# Patient Record
Sex: Female | Born: 1980
Health system: Southern US, Community
[De-identification: ages and names within clinical notes are randomized; demographics above are authoritative.]

## PROBLEM LIST (undated history)

## (undated) DIAGNOSIS — R079 Chest pain, unspecified: Secondary | ICD-10-CM

## (undated) DIAGNOSIS — E669 Obesity, unspecified: Secondary | ICD-10-CM

## (undated) DIAGNOSIS — I10 Essential (primary) hypertension: Secondary | ICD-10-CM

## (undated) DIAGNOSIS — Z973 Presence of spectacles and contact lenses: Secondary | ICD-10-CM

## (undated) DIAGNOSIS — Z9109 Other allergy status, other than to drugs and biological substances: Secondary | ICD-10-CM

## (undated) HISTORY — DX: Presence of spectacles and contact lenses: Z97.3

## (undated) HISTORY — DX: Obesity, unspecified: E66.9

## (undated) HISTORY — DX: Chest pain, unspecified: R07.9

---

## 2007-07-16 ENCOUNTER — Emergency Department (HOSPITAL_COMMUNITY): Admission: EM | Admit: 2007-07-16 | Discharge: 2007-07-16 | Payer: Self-pay | Admitting: Emergency Medicine

## 2007-07-18 ENCOUNTER — Emergency Department (HOSPITAL_COMMUNITY): Admission: EM | Admit: 2007-07-18 | Discharge: 2007-07-18 | Payer: Self-pay | Admitting: Emergency Medicine

## 2007-07-25 ENCOUNTER — Emergency Department (HOSPITAL_COMMUNITY): Admission: EM | Admit: 2007-07-25 | Discharge: 2007-07-25 | Payer: Self-pay | Admitting: *Deleted

## 2008-08-10 ENCOUNTER — Inpatient Hospital Stay (HOSPITAL_COMMUNITY): Admission: EM | Admit: 2008-08-10 | Discharge: 2008-08-15 | Payer: Self-pay | Admitting: Emergency Medicine

## 2008-08-12 ENCOUNTER — Encounter (INDEPENDENT_AMBULATORY_CARE_PROVIDER_SITE_OTHER): Payer: Self-pay | Admitting: Cardiology

## 2008-11-01 DIAGNOSIS — R079 Chest pain, unspecified: Secondary | ICD-10-CM

## 2008-11-01 HISTORY — DX: Chest pain, unspecified: R07.9

## 2009-08-27 IMAGING — CR DG CHEST 1V PORT
1 series · 1 of 1 positions shown · non-contrast
Comparison: None

CLINICAL DATA: Chest pain and shortness of breath.  Palpitations.
Atrial fibrillation.

CHEST - 1 VIEW

[AP]
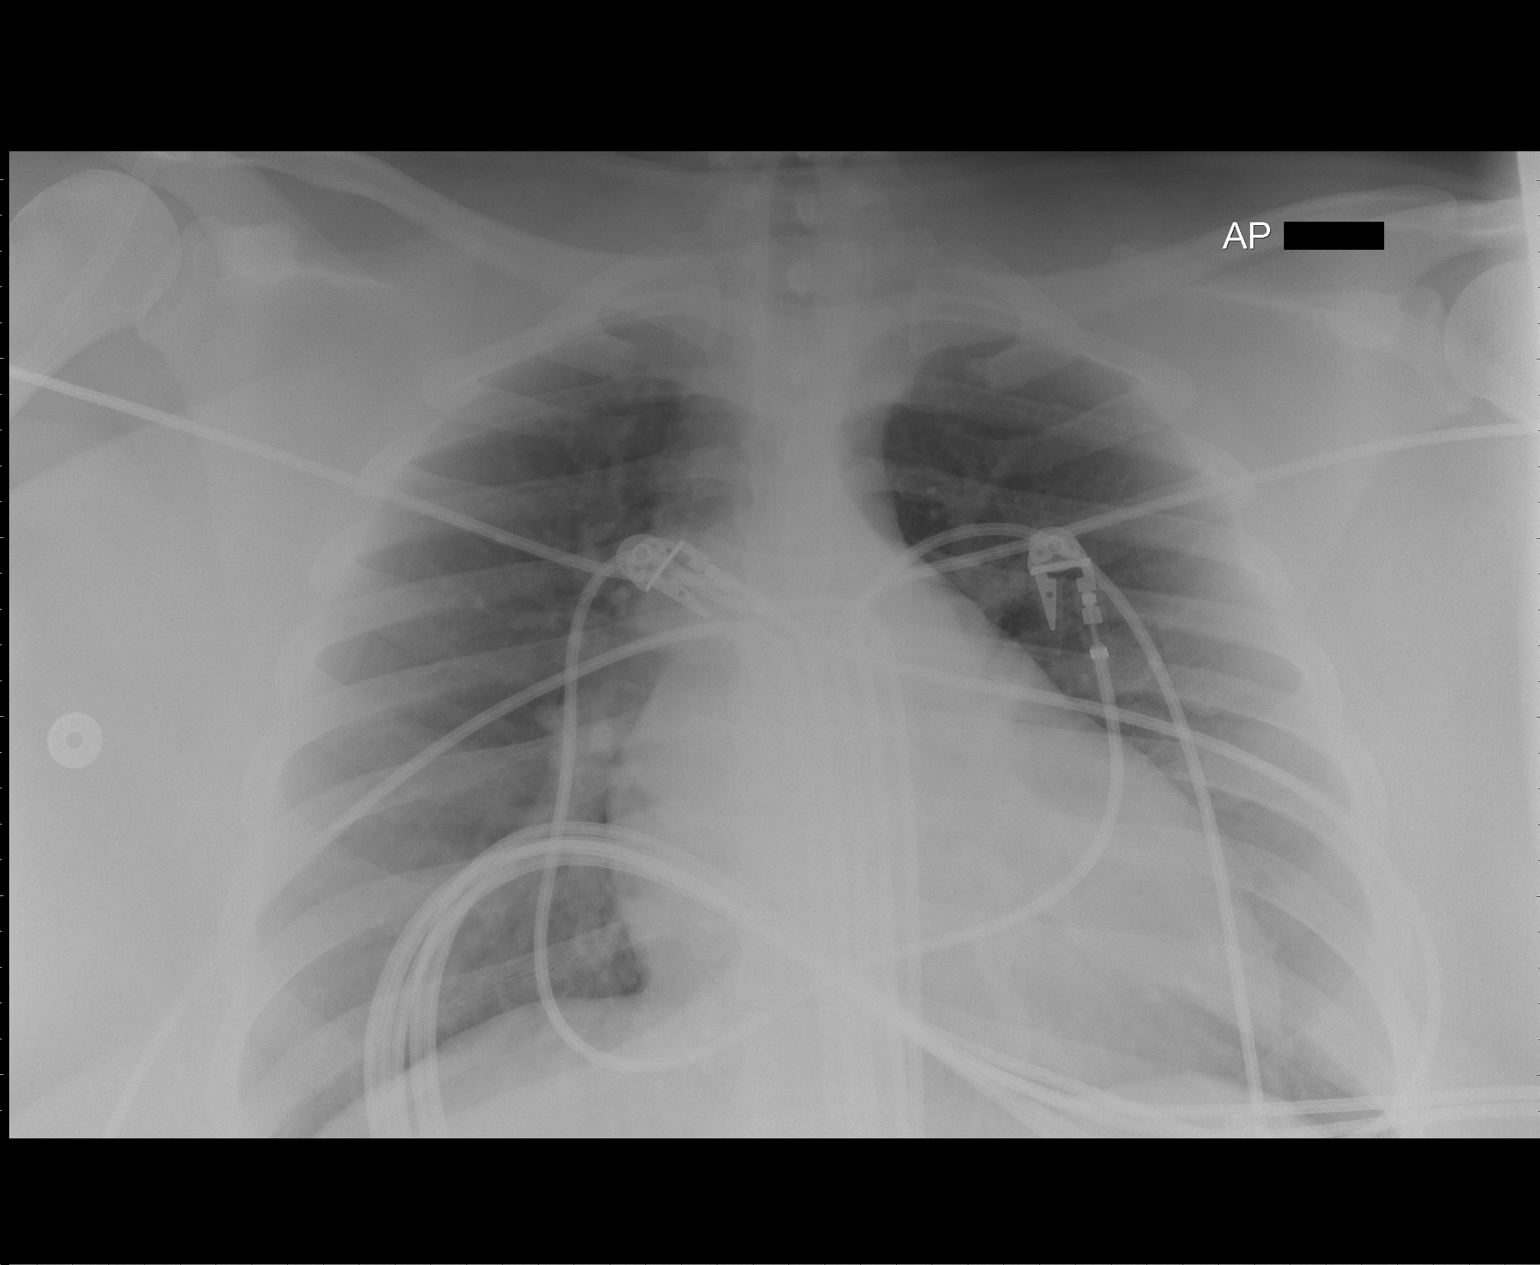

[1 of 1 positions shown; findings below may reference images not displayed]

FINDINGS: The heart size and mediastinal contours are within normal
limits.  Both lungs are clear.
IMPRESSION: No active disease.

## 2010-05-11 ENCOUNTER — Emergency Department (HOSPITAL_BASED_OUTPATIENT_CLINIC_OR_DEPARTMENT_OTHER): Admission: EM | Admit: 2010-05-11 | Discharge: 2010-05-11 | Payer: Self-pay | Admitting: Emergency Medicine

## 2010-10-09 ENCOUNTER — Encounter (INDEPENDENT_AMBULATORY_CARE_PROVIDER_SITE_OTHER): Payer: Self-pay | Admitting: *Deleted

## 2010-10-09 LAB — CONVERTED CEMR LAB
AST: 15 units/L (ref 0–37)
Alkaline Phosphatase: 54 units/L (ref 39–117)
BUN: 11 mg/dL (ref 6–23)
CO2: 24 meq/L (ref 19–32)
Calcium: 8.3 mg/dL — ABNORMAL LOW (ref 8.4–10.5)
Creatinine, Ser: 0.69 mg/dL (ref 0.40–1.20)
Eosinophils Relative: 2 % (ref 0–5)
Glucose, Bld: 92 mg/dL (ref 70–99)
HCT: 39.8 % (ref 36.0–46.0)
HDL: 50 mg/dL (ref >39)
Hgb A1c MFr Bld: 5.8 % — ABNORMAL HIGH (ref <5.7)
Lymphocytes Relative: 22 % (ref 12–46)
Lymphs Abs: 2.2 10*3/uL (ref 0.7–4.0)
MCHC: 32.9 g/dL (ref 30.0–36.0)
MCV: 87.7 fL (ref 78.0–100.0)
Monocytes Absolute: 0.6 10*3/uL (ref 0.1–1.0)
Platelets: 335 10*3/uL (ref 150–400)
RDW: 13.9 % (ref 11.5–15.5)
Sodium: 138 meq/L (ref 135–145)
Total CHOL/HDL Ratio: 3.4
WBC: 9.9 10*3/uL (ref 4.0–10.5)

## 2011-03-19 NOTE — Discharge Summary (Signed)
Kathy [name], Kathy [name]                  ACCOUNT NO.:  [id number]   MEDICAL RECORD NO.:  [id number]          PATIENT TYPE:  INP   LOCATION:  3738                         FACILITY:  [facility]   PHYSICIAN:  Osvaldo Shipper. Spruill, M.D.DATE OF BIRTH:  1981/04/09   DATE OF ADMISSION:  08/10/2008  DATE OF DISCHARGE:  08/15/2008                               DISCHARGE SUMMARY   DISCHARGE DIAGNOSES:  1. Atrial fibrillation.  2. Hypopotassemia.  3. Paroxysmal atrial tachycardia.  4. Hypertension.  5. Morbid obesity.   HISTORY OF PRESENT ILLNESS:  Ms. Hitchcock is a 30 year old patient with a  history of obesity and hypertension who was admitted with  palpitations.  The patient was apparently awakened with hard breathing  and a sensation that her heart was beating irregularly accompanied by  dyspnea.  She denied any syncopal episodes.  There was no chest pain.  The patient came to the Emergency Department where she was found to have  a supraventricular tachycardia with a rate greater than 170.  She was  started on amiodarone.  She has a history of hypertension, has been  noncompliant with me as well with follow up.   On evaluation, her initial history and physical was completed by Dr.  Donia Guiles.  At that time, her physical examination revealed blood  pressure of 166/101, and heart rate of 158.  As noted above, she is a  morbidly obese female.  There was no jugular venous distention, and no  bruits noted in the neck.  There is normal S1 and S2.  No S3 was  present.  There was mild right calf tenderness, otherwise, extremities  were within normal limits.   The patient was admitted to the Medical Service, placed on telemetry.  Her amiodarone was tapered off.  She was placed on Toprol-XL 50 mg.  She  was also seen by Pharmacy for Coumadin protocol, and a CT chest was  obtained to rule out possibility of pulmonary embolus.  Cardizem CD 240  mg was also initiated.   On August 11, 2008, heart rate was  down to 64.   The thyroid studies were obtained, and these were found to be within the  normal limits.  Serial cardiac markers were also obtained.  The initial  CK was 318 with an MB of 3.6 and a relative index of 1.1.  The troponin  was elevated at 0.08.  A second marker revealed a CK of 302 with a CK-MB  of 9.4, relative index of 3.1, and a troponin of 0.70; and a third  marker was also elevated with a CK-MB of 8.7, relative index of 3.6, and  a troponin of 0.63.  The patient underwent a transthoracic  echocardiogram by Dr. Rinaldo Cloud, which revealed the left ventricular  systolic function was normal.  The left ventricular ejection fraction  was estimated to be 50%-55%.  There was no ventricular regional wall  motion abnormality.  There was trivial aortic valvular regurgitation,  and left atrium was mildly dilated.  A CT scan of the chest was negative  for pulmonary embolism.  There  was no aortic dissection or aneurysm.  The lungs were clear.  There was no mass or adenopathy appreciated.     The INR was gradually within an acceptable range.   On August 15, 2008, it was the opinion of the attending physician that  the patient had received maximum benefit from this hospitalization and  could be discharged home.  The patient was noted to have no pulmonary  effusions, no thyroid dysfunction, and no pulmonary emboli.  She was  responding well to the Cardizem and beta-blocker therapy.  Plans were  made for the patient to be studied as an outpatient for sleep apnea as  well as additional cardiac evaluation.  The patient is to notify the  physician immediately if any changes, problems, or concerns.   DISCHARGE DIAGNOSES:  1. Metoprolol 50 mg daily.  2. Cardizem CD 240 mg one daily.  3. Coumadin 7.5 mg daily.   The patient is to be seen in the Coumadin Clinic for INR evaluations  weekly.  She is to notify the physician immediately if any changes,  problems, or concerns.  She will be  seen in the office in 1 week.      Ivery Quale, P.A.      Osvaldo Shipper. Spruill, M.D.  Electronically Signed    HB/MEDQ  D:  09/29/2008  T:  09/30/2008  Job:  [id number]

## 2011-08-03 LAB — CARDIAC PANEL(CRET KIN+CKTOT+MB+TROPI)
CK, MB: 9.4 — ABNORMAL HIGH
Relative Index: 3.6 — ABNORMAL HIGH
Total CK: 302 — ABNORMAL HIGH
Troponin I: 0.7

## 2011-08-03 LAB — PROTIME-INR
INR: 1.2
INR: 1.6 — ABNORMAL HIGH
INR: 1.8 — ABNORMAL HIGH
INR: 2 — ABNORMAL HIGH
Prothrombin Time: 13.3
Prothrombin Time: 14.3
Prothrombin Time: 15.7 — ABNORMAL HIGH
Prothrombin Time: 19.6 — ABNORMAL HIGH
Prothrombin Time: 22.1 — ABNORMAL HIGH
Prothrombin Time: 23.4 — ABNORMAL HIGH

## 2011-08-03 LAB — CBC
HCT: 35.3 — ABNORMAL LOW
Hemoglobin: 12
Hemoglobin: 12.2
Hemoglobin: 12.4
Hemoglobin: 12.6
Hemoglobin: 12.6
Hemoglobin: 13.5
MCHC: 33
MCHC: 33.3
MCHC: 33.7
MCHC: 33.7
Platelets: 287
Platelets: 287
Platelets: 289
Platelets: 292
Platelets: 294
Platelets: 334
RBC: 4.05
RDW: 13.7
RDW: 13.9
RDW: 14
RDW: 14.1
RDW: 14.1
WBC: 13 — ABNORMAL HIGH

## 2011-08-03 LAB — URINALYSIS, ROUTINE W REFLEX MICROSCOPIC
Bilirubin Urine: NEGATIVE
Hgb urine dipstick: NEGATIVE
Urobilinogen, UA: 0.2
pH: 7.5

## 2011-08-03 LAB — POCT I-STAT, CHEM 8
BUN: 5 — ABNORMAL LOW
HCT: 42
Potassium: 3.2 — ABNORMAL LOW

## 2011-08-03 LAB — COMPREHENSIVE METABOLIC PANEL
AST: 23
Albumin: 3.7
Alkaline Phosphatase: 58
BUN: 4 — ABNORMAL LOW
Creatinine, Ser: 0.66
GFR calc Af Amer: >60
GFR calc non Af Amer: >60
Potassium: 3.2 — ABNORMAL LOW
Sodium: 140
Total Bilirubin: 0.5
Total Protein: 7.2

## 2011-08-03 LAB — TSH: TSH: 3.746

## 2011-08-03 LAB — DIFFERENTIAL
Basophils Relative: 0
Eosinophils Absolute: 0.3
Lymphs Abs: 2.5
Neutro Abs: 9.4 — ABNORMAL HIGH

## 2011-08-03 LAB — BASIC METABOLIC PANEL
CO2: 25
Calcium: 8.5
Creatinine, Ser: 0.61
GFR calc Af Amer: >60
GFR calc non Af Amer: >60
Glucose, Bld: 94
Sodium: 137

## 2011-08-03 LAB — HEPARIN LEVEL (UNFRACTIONATED)
Heparin Unfractionated: 0.4
Heparin Unfractionated: 0.48
Heparin Unfractionated: 0.5
Heparin Unfractionated: 0.57
Heparin Unfractionated: 0.62

## 2011-08-03 LAB — POCT PREGNANCY, URINE: Preg Test, Ur: NEGATIVE

## 2011-08-03 LAB — MAGNESIUM: Magnesium: 2.1

## 2011-08-03 LAB — POCT CARDIAC MARKERS: Troponin i, poc: <0.05

## 2011-08-03 LAB — TROPONIN I: Troponin I: 0.08 — ABNORMAL HIGH

## 2011-08-03 LAB — CK TOTAL AND CKMB (NOT AT ARMC)
Relative Index: 1.1
Total CK: 318 — ABNORMAL HIGH

## 2011-08-03 LAB — T4: T4, Total: 8.5

## 2011-08-12 LAB — URINALYSIS, ROUTINE W REFLEX MICROSCOPIC
Bilirubin Urine: NEGATIVE
Hgb urine dipstick: NEGATIVE
Ketones, ur: NEGATIVE
Nitrite: NEGATIVE
Specific Gravity, Urine: 1.019
Urobilinogen, UA: 1

## 2011-08-12 LAB — POCT PREGNANCY, URINE: Operator id: 29008

## 2011-08-13 LAB — URINALYSIS, ROUTINE W REFLEX MICROSCOPIC
Hgb urine dipstick: NEGATIVE
Nitrite: NEGATIVE
Specific Gravity, Urine: 1.027
Urobilinogen, UA: 1
pH: 7.5

## 2011-08-13 LAB — PREGNANCY, URINE: Preg Test, Ur: NEGATIVE

## 2011-12-27 ENCOUNTER — Emergency Department (HOSPITAL_COMMUNITY)
Admission: EM | Admit: 2011-12-27 | Discharge: 2011-12-27 | Disposition: A | Discharge status: Home / Self Care | Payer: Self-pay | Attending: Emergency Medicine | Admitting: Emergency Medicine

## 2011-12-27 DIAGNOSIS — Z79899 Other long term (current) drug therapy: Secondary | ICD-10-CM | POA: Insufficient documentation

## 2011-12-27 DIAGNOSIS — I10 Essential (primary) hypertension: Secondary | ICD-10-CM | POA: Insufficient documentation

## 2011-12-27 LAB — POCT I-STAT, CHEM 8
Chloride: 103 mEq/L (ref 96–112)
HCT: 38 % (ref 36.0–46.0)
Hemoglobin: 12.9 g/dL (ref 12.0–15.0)
Potassium: 3.2 mEq/L — ABNORMAL LOW (ref 3.5–5.1)
Sodium: 142 mEq/L (ref 135–145)

## 2011-12-27 MED ORDER — LISINOPRIL 20 MG PO TABS: 20.0000 mg | ORAL_TABLET | Freq: Every day | ORAL | Status: DC

## 2011-12-27 NOTE — ED Notes (Signed)
Pt st's she stopped taking her meds for HTN because it made her feel bad.  Pt's mom and sister at bedside.

## 2011-12-27 NOTE — Discharge Instructions (Signed)
Hypertension Information As your heart beats, it forces blood through your arteries. This force is your blood pressure. If the pressure is too high, it is called hypertension (HTN) or high blood pressure. HTN is dangerous because you may have it and not know it. High blood pressure may mean that your heart has to work harder to pump blood. Your arteries may be narrow or stiff. The extra work puts you at risk for heart disease, stroke, and other problems.  Blood pressure consists of two numbers, a higher number over a lower, 110/72, for example. It is stated as "110 over 72." The ideal is below 120 for the top number (systolic) and under 80 for the bottom (diastolic).  You should pay close attention to your blood pressure if you have certain conditions such as:  Heart failure.   Prior heart attack.   Diabetes   Chronic kidney disease.   Prior stroke.   Multiple risk factors for heart disease.  To see if you have HTN, your blood pressure should be measured while you are seated with your arm held at the level of the heart. It should be measured at least twice. A one-time elevated blood pressure reading (especially in the Emergency Department) does not mean that you need treatment. There may be conditions in which the blood pressure is different between your right and left arms. It is important to see your caregiver soon for a recheck. Most people have essential hypertension which means that there is not a specific cause. This type of high blood pressure may be lowered by changing lifestyle factors such as:  Stress.   Smoking.   Lack of exercise.   Excessive weight.   Drug/tobacco/alcohol use.   Eating less salt.  Most people do not have symptoms from high blood pressure until it has caused damage to the body. Effective treatment can often prevent, delay or reduce that damage. TREATMENT  Treatment for high blood pressure, when a cause has been identified, is directed at the cause. There  are a large number of medications to treat HTN. These fall into several categories, and your caregiver will help you select the medicines that are best for you. Medications may have side effects. You should review side effects with your caregiver. If your blood pressure stays high after you have made lifestyle changes or started on medicines,   Your medication(s) may need to be changed.   Other problems may need to be addressed.   Be certain you understand your prescriptions, and know how and when to take your medicine.   Be sure to follow up with your caregiver within the time frame advised (usually within two weeks) to have your blood pressure rechecked and to review your medications.   If you are taking more than one medicine to lower your blood pressure, make sure you know how and at what times they should be taken. Taking two medicines at the same time can result in blood pressure that is too low.  Document Released: 12/21/2005 Document Revised: 06/30/2011 Document Reviewed: 12/28/2007 [employer] Patient Information 2012 [employer]. 

## 2011-12-27 NOTE — ED Notes (Signed)
To ed for eval of HTN. Pt has hx of same and is prescribed meds but not taking for past 6 months. [facility]

## 2011-12-27 NOTE — ED Provider Notes (Addendum)
History     CSN: [id number]  Arrival date & time 12/27/11  1555   First MD Initiated Contact with Patient 12/27/11 1750      Chief Complaint  Patient presents with  . Hypertension    (Consider location/radiation/quality/duration/timing/severity/associated sxs/prior treatment) HPI Comments: Patient was seen at her doctor's today and due to having elevated blood pressure was sent here.  Patient is a 31 y.o. female presenting with hypertension. The history is provided by the patient.  Hypertension This is a chronic problem. Episode onset: Off medication for 6 month. The problem occurs constantly. The problem has not changed since onset.Pertinent negatives include no chest pain, no abdominal pain, no headaches and no shortness of breath. The symptoms are aggravated by nothing. The symptoms are relieved by nothing. She has tried nothing for the symptoms. The treatment provided no relief.    No past medical history on file.  No past surgical history on file.  No family history on file.  History  Substance Use Topics  . Smoking status: Not on file  . Smokeless tobacco: Not on file  . Alcohol Use: Not on file    OB History    No data available      Review of Systems  Respiratory: Negative for shortness of breath.   Cardiovascular: Negative for chest pain.  Gastrointestinal: Negative for abdominal pain.  Neurological: Negative for headaches.  All other systems reviewed and are negative.    Allergies  Shrimp  Home Medications   Current Outpatient Rx  Name Route Sig Dispense Refill  . MIDOL COMPLETE PO Oral Take 1 tablet by mouth every 6 (six) hours as needed. For menstrual cramps    . DIPHENHYDRAMINE HCL 25 MG PO TABS Oral Take 25 mg by mouth 3 (three) times a week.    Marland Kitchen DM-PHENYLEPHRINE-ACETAMINOPHEN 10-5-325 MG PO CAPS Oral Take 1 tablet by mouth daily.      BP 188/105  Pulse 82  Temp(Src) 98.6 F (37 C) (Oral)  Resp 16  SpO2 100%  Physical Exam  Nursing  note and vitals reviewed. Constitutional: She is oriented to person, place, and time. She appears well-developed and well-nourished. No distress.  HENT:  Head: Normocephalic and atraumatic.  Mouth/Throat: Oropharynx is clear and moist.  Eyes: Conjunctivae and EOM are normal. Pupils are equal, round, and reactive to light.  Neck: Normal range of motion. Neck supple.  Cardiovascular: Normal rate, regular rhythm and intact distal pulses.   No murmur heard. Pulmonary/Chest: Effort normal and breath sounds normal. No respiratory distress. She has no wheezes. She has no rales.  Abdominal: Soft. She exhibits no distension. There is no tenderness. There is no rebound and no guarding.  Musculoskeletal: Normal range of motion. She exhibits no edema and no tenderness.  Neurological: She is alert and oriented to person, place, and time.  Skin: Skin is warm and dry. No rash noted. No erythema.  Psychiatric: She has a normal mood and affect. Her behavior is normal.    ED Course  Procedures (including critical care time)  Labs Reviewed - No data to display No results found.   1. Hypertension       MDM   [facility]. Patient states she's been off medications for 6 months. She has a long-standing history of hypertension since she was 31 years old. She has not been taking any medication for the last 6 months because she states it made her feel bad. She has a prescription for  Toprol and lisinopril HCTZ combination. Repeat blood pressure here was 182/90. She denies any chest pain, shortness of breath, headache, weakness or any signs of endorgan damage. The patient does need to be on blood pressure medication but is not an emergency. Will check an i-STAT to ensure normal renal function and start her on lisinopril to followup with her regular doctor.        Gwyneth Sprout, MD 12/27/11 0865  Gwyneth Sprout, MD 12/27/11 [phone]

## 2013-08-24 ENCOUNTER — Encounter (HOSPITAL_COMMUNITY): Payer: Self-pay | Admitting: Emergency Medicine

## 2013-08-24 ENCOUNTER — Emergency Department (HOSPITAL_COMMUNITY): Payer: No Typology Code available for payment source

## 2013-08-24 ENCOUNTER — Emergency Department (HOSPITAL_COMMUNITY)
Admission: EM | Admit: 2013-08-24 | Discharge: 2013-08-24 | Disposition: A | Discharge status: Home / Self Care | Payer: No Typology Code available for payment source | Attending: Emergency Medicine | Admitting: Emergency Medicine

## 2013-08-24 DIAGNOSIS — S298XXA Other specified injuries of thorax, initial encounter: Secondary | ICD-10-CM | POA: Insufficient documentation

## 2013-08-24 DIAGNOSIS — I10 Essential (primary) hypertension: Secondary | ICD-10-CM | POA: Insufficient documentation

## 2013-08-24 DIAGNOSIS — Z8709 Personal history of other diseases of the respiratory system: Secondary | ICD-10-CM | POA: Insufficient documentation

## 2013-08-24 DIAGNOSIS — Y92410 Unspecified street and highway as the place of occurrence of the external cause: Secondary | ICD-10-CM | POA: Insufficient documentation

## 2013-08-24 DIAGNOSIS — Y9389 Activity, other specified: Secondary | ICD-10-CM | POA: Insufficient documentation

## 2013-08-24 DIAGNOSIS — R0789 Other chest pain: Secondary | ICD-10-CM

## 2013-08-24 DIAGNOSIS — S3981XA Other specified injuries of abdomen, initial encounter: Secondary | ICD-10-CM | POA: Insufficient documentation

## 2013-08-24 DIAGNOSIS — E669 Obesity, unspecified: Secondary | ICD-10-CM | POA: Insufficient documentation

## 2013-08-24 HISTORY — DX: Other allergy status, other than to drugs and biological substances: Z91.09

## 2013-08-24 HISTORY — DX: Essential (primary) hypertension: I10

## 2013-08-24 NOTE — ED Notes (Signed)
E signature pad not working 
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
## 2014-05-11 ENCOUNTER — Emergency Department (HOSPITAL_COMMUNITY)
Admission: EM | Admit: 2014-05-11 | Discharge: 2014-05-11 | Disposition: A | Discharge status: Home / Self Care | Payer: No Typology Code available for payment source | Attending: Emergency Medicine | Admitting: Emergency Medicine

## 2014-05-11 ENCOUNTER — Encounter (HOSPITAL_COMMUNITY): Payer: Self-pay | Admitting: Emergency Medicine

## 2014-05-11 DIAGNOSIS — IMO0002 Reserved for concepts with insufficient information to code with codable children: Secondary | ICD-10-CM | POA: Insufficient documentation

## 2014-05-11 DIAGNOSIS — Y92410 Unspecified street and highway as the place of occurrence of the external cause: Secondary | ICD-10-CM | POA: Insufficient documentation

## 2014-05-11 DIAGNOSIS — R110 Nausea: Secondary | ICD-10-CM | POA: Insufficient documentation

## 2014-05-11 DIAGNOSIS — E669 Obesity, unspecified: Secondary | ICD-10-CM | POA: Insufficient documentation

## 2014-05-11 DIAGNOSIS — I10 Essential (primary) hypertension: Secondary | ICD-10-CM

## 2014-05-11 DIAGNOSIS — M545 Low back pain: Secondary | ICD-10-CM

## 2014-05-11 DIAGNOSIS — Y9389 Activity, other specified: Secondary | ICD-10-CM | POA: Insufficient documentation

## 2014-05-11 LAB — I-STAT CHEM 8, ED
BUN: 9 mg/dL (ref 6–23)
CALCIUM ION: 1.09 mmol/L — AB (ref 1.12–1.23)
CREATININE: 0.7 mg/dL (ref 0.50–1.10)
Chloride: 107 mEq/L (ref 96–112)
GLUCOSE: 103 mg/dL — AB (ref 70–99)
HCT: 41 % (ref 36.0–46.0)
HEMOGLOBIN: 13.9 g/dL (ref 12.0–15.0)
Potassium: 3.2 mEq/L — ABNORMAL LOW (ref 3.7–5.3)
SODIUM: 140 meq/L (ref 137–147)
TCO2: 27 mmol/L (ref 0–100)

## 2014-05-11 MED ORDER — LISINOPRIL 20 MG PO TABS: 20.0000 mg | ORAL_TABLET | Freq: Every day | ORAL | Status: DC

## 2014-05-11 MED ORDER — LISINOPRIL 10 MG PO TABS
10.0000 mg | ORAL_TABLET | Freq: Once | ORAL | Status: AC
  Administered 2014-05-11: 10 mg via ORAL
  Filled 2014-05-11: qty 1

## 2014-05-11 MED ORDER — IBUPROFEN 800 MG PO TABS: 800.0000 mg | ORAL_TABLET | Freq: Three times a day (TID) | ORAL | Status: DC | PRN

## 2014-05-11 MED ORDER — CYCLOBENZAPRINE HCL 10 MG PO TABS: 10.0000 mg | ORAL_TABLET | Freq: Three times a day (TID) | ORAL | Status: DC | PRN

## 2014-05-11 MED ORDER — POTASSIUM CHLORIDE CRYS ER 20 MEQ PO TBCR
40.0000 meq | EXTENDED_RELEASE_TABLET | Freq: Once | ORAL | Status: AC
  Administered 2014-05-11: 40 meq via ORAL
  Filled 2014-05-11: qty 2
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
## 2014-05-12 NOTE — ED Provider Notes (Signed)
Medical screening examination/treatment/procedure(s) were performed by non-physician practitioner and as supervising physician I was immediately available for consultation/collaboration.   EKG Interpretation None        Floy Angert, MD 05/12/14 0020 

## 2014-05-27 NOTE — Discharge Planning (Signed)
[facility] Community Liaison Ext [phone]  Pts information given to me by Idelle CrouchPA-C Emily W. GCCN orange card information and resources will be sent to the address provided.

## 2015-05-20 ENCOUNTER — Encounter: Payer: Self-pay | Admitting: Medical

## 2015-06-02 HISTORY — PX: NO PAST SURGERIES: SHX2092

## 2015-06-10 ENCOUNTER — Ambulatory Visit (INDEPENDENT_AMBULATORY_CARE_PROVIDER_SITE_OTHER): Payer: BLUE CROSS/BLUE SHIELD | Admitting: Medical

## 2015-06-10 ENCOUNTER — Encounter: Payer: Self-pay | Admitting: Medical

## 2015-06-10 ENCOUNTER — Other Ambulatory Visit (HOSPITAL_COMMUNITY)
Admission: RE | Admit: 2015-06-10 | Discharge: 2015-06-10 | Disposition: A | Discharge status: Home / Self Care | Payer: BLUE CROSS/BLUE SHIELD | Source: Ambulatory Visit | Attending: Medical | Admitting: Medical

## 2015-06-10 VITALS — BP 110/64 | HR 76 | Temp 97.9°F | Resp 16 | Ht 65.0 in | Wt 314.4 lb

## 2015-06-10 DIAGNOSIS — Z113 Encounter for screening for infections with a predominantly sexual mode of transmission: Secondary | ICD-10-CM | POA: Insufficient documentation

## 2015-06-10 DIAGNOSIS — Z1151 Encounter for screening for human papillomavirus (HPV): Secondary | ICD-10-CM | POA: Diagnosis present

## 2015-06-10 DIAGNOSIS — Z23 Encounter for immunization: Secondary | ICD-10-CM

## 2015-06-10 DIAGNOSIS — Z0000 Encounter for general adult medical examination without abnormal findings: Secondary | ICD-10-CM | POA: Diagnosis not present

## 2015-06-10 DIAGNOSIS — Z124 Encounter for screening for malignant neoplasm of cervix: Secondary | ICD-10-CM

## 2015-06-10 DIAGNOSIS — Z01419 Encounter for gynecological examination (general) (routine) without abnormal findings: Secondary | ICD-10-CM | POA: Diagnosis not present

## 2015-06-10 DIAGNOSIS — Z7185 Encounter for immunization safety counseling: Secondary | ICD-10-CM

## 2015-06-10 DIAGNOSIS — I10 Essential (primary) hypertension: Secondary | ICD-10-CM

## 2015-06-10 DIAGNOSIS — Z7189 Other specified counseling: Secondary | ICD-10-CM | POA: Diagnosis not present

## 2015-06-10 LAB — COMPREHENSIVE METABOLIC PANEL
ALK PHOS: 55 U/L (ref 33–115)
ALT: 15 U/L (ref 6–29)
AST: 16 U/L (ref 10–30)
Albumin: 3.9 g/dL (ref 3.6–5.1)
BUN: 13 mg/dL (ref 7–25)
CALCIUM: 8.7 mg/dL (ref 8.6–10.2)
CO2: 24 mmol/L (ref 20–31)
Chloride: 104 mmol/L (ref 98–110)
Creat: 0.75 mg/dL (ref 0.50–1.10)
Glucose, Bld: 97 mg/dL (ref 65–99)
POTASSIUM: 4.1 mmol/L (ref 3.5–5.3)
Sodium: 140 mmol/L (ref 135–146)
Total Bilirubin: 0.6 mg/dL (ref 0.2–1.2)
Total Protein: 7.3 g/dL (ref 6.1–8.1)

## 2015-06-10 LAB — CBC
HEMATOCRIT: 39.5 % (ref 36.0–46.0)
Hemoglobin: 13.4 g/dL (ref 12.0–15.0)
MCH: 29.8 pg (ref 26.0–34.0)
MCHC: 33.9 g/dL (ref 30.0–36.0)
MCV: 88 fL (ref 78.0–100.0)
MPV: 10.8 fL (ref 8.6–12.4)
PLATELETS: 304 10*3/uL (ref 150–400)
RBC: 4.49 MIL/uL (ref 3.87–5.11)
RDW: 13.9 % (ref 11.5–15.5)
WBC: 9.2 10*3/uL (ref 4.0–10.5)

## 2015-06-10 LAB — HEMOGLOBIN A1C
Hgb A1c MFr Bld: 5.4 % (ref <5.7)
MEAN PLASMA GLUCOSE: 108 mg/dL (ref <117)

## 2015-06-10 LAB — LIPID PANEL
Cholesterol: 176 mg/dL (ref 125–200)
HDL: 56 mg/dL (ref >=46)
LDL Cholesterol: 107 mg/dL (ref <130)
Total CHOL/HDL Ratio: 3.1 Ratio (ref <=5.0)
Triglycerides: 63 mg/dL (ref <150)
VLDL: 13 mg/dL (ref <30)

## 2015-06-10 NOTE — Patient Instructions (Addendum)
Thank you for giving me the opportunity to serve you today.   Your diagnoses today includes: Encounter Diagnoses  Name Primary?  . Encounter for health maintenance examination in adult Yes  . Morbid obesity   . Essential hypertension   . Screening for cervical cancer   . Screen for STD (sexually transmitted disease)   . Need for TD vaccine   . Vaccine counseling    Specific recommendations today include Exercise most days per week, preferably at least 30 minutes every day See your eye doctor yearly for routine vision care. See your dentist yearly for routine dental care including hygiene visits twice yearly. Return yearly for physical and pap smear We will call with lab reuslts Start keeping track of your blood pressures such as taking it once weekly at the gym   Vaccine recommendations: Return in late August for flu vaccine We updated your tetanus diptheria vaccine today  Please follow up pending labs  I have included other useful information below for your review.  Preventative Care for Adults - Female      MAINTAIN REGULAR HEALTH EXAMS:  A routine yearly physical is a good way to check in with your primary care provider about your health and preventive screening. It is also an opportunity to share updates about your health and any concerns you have, and receive a thorough all-over exam.   Most health insurance companies pay for at least some preventative services.  Check with your health plan for specific coverages.  WHAT PREVENTATIVE SERVICES DO WOMEN NEED?  Adult women should have their weight and blood pressure checked regularly.   Women age 71 and older should have their cholesterol levels checked regularly.  Women should be screened for cervical cancer with a Pap smear and pelvic exam beginning at either age 72, or 3 years after they become sexually activity.    Breast cancer screening generally begins at age 73 with a mammogram and breast exam by your primary  care provider.    Beginning at age 87 and continuing to age 2, women should be screened for colorectal cancer.  Certain people may need continued testing until age 12.  Updating vaccinations is part of preventative care.  Vaccinations help protect against diseases such as the flu.  Osteoporosis is a disease in which the bones lose minerals and strength as we age. Women ages 61 and over should discuss this with their caregivers, as should women after menopause who have other risk factors.  Lab tests are generally done as part of preventative care to screen for anemia and blood disorders, to screen for problems with the kidneys and liver, to screen for bladder problems, to check blood sugar, and to check your cholesterol level.  Preventative services generally include counseling about diet, exercise, avoiding tobacco, drugs, excessive alcohol consumption, and sexually transmitted infections.    GENERAL RECOMMENDATIONS FOR GOOD HEALTH:  Healthy diet:  Eat a variety of foods, including fruit, vegetables, animal or vegetable protein, such as meat, fish, chicken, and eggs, or beans, lentils, tofu, and grains, such as rice.  Drink plenty of water daily.  Decrease saturated fat in the diet, avoid lots of red meat, processed foods, sweets, fast foods, and fried foods.  Exercise:  Aerobic exercise helps maintain good heart health. At least 30-40 minutes of moderate-intensity exercise is recommended. For example, a brisk walk that increases your heart rate and breathing. This should be done on most days of the week.   Find a type of exercise  or a variety of exercises that you enjoy so that it becomes a part of your daily life.  Examples are running, walking, swimming, water aerobics, and biking.  For motivation and support, explore group exercise such as aerobic class, spin class, Zumba, Yoga,or  martial arts, etc.    Set exercise goals for yourself, such as a certain weight goal, walk or run in a  race such as a 5k walk/run.  Speak to your primary care provider about exercise goals.  Disease prevention:  If you smoke or chew tobacco, find out from your caregiver how to quit. It can literally save your life, no matter how long you have been a tobacco user. If you do not use tobacco, never begin.   Maintain a healthy diet and normal weight. Increased weight leads to problems with blood pressure and diabetes.   The Body Mass Index or BMI is a way of measuring how much of your body is fat. Having a BMI above 27 increases the risk of heart disease, diabetes, hypertension, stroke and other problems related to obesity. Your caregiver can help determine your BMI and based on it develop an exercise and dietary program to help you achieve or maintain this important measurement at a healthful level.  High blood pressure causes heart and blood vessel problems.  Persistent high blood pressure should be treated with medicine if weight loss and exercise do not work.   Fat and cholesterol leaves deposits in your arteries that can block them. This causes heart disease and vessel disease elsewhere in your body.  If your cholesterol is found to be high, or if you have heart disease or certain other medical conditions, then you may need to have your cholesterol monitored frequently and be treated with medication.   Ask if you should have a cardiac stress test if your history suggests this. A stress test is a test done on a treadmill that looks for heart disease. This test can find disease prior to there being a problem.  Menopause can be associated with physical symptoms and risks. Hormone replacement therapy is available to decrease these. You should talk to your caregiver about whether starting or continuing to take hormones is right for you.   Osteoporosis is a disease in which the bones lose minerals and strength as we age. This can result in serious bone fractures. Risk of osteoporosis can be identified  using a bone density scan. Women ages 38 and over should discuss this with their caregivers, as should women after menopause who have other risk factors. Ask your caregiver whether you should be taking a calcium supplement and Vitamin D, to reduce the rate of osteoporosis.   Avoid drinking alcohol in excess (more than two drinks per day).  Avoid use of street drugs. Do not share needles with anyone. Ask for professional help if you need assistance or instructions on stopping the use of alcohol, cigarettes, and/or drugs.  Brush your teeth twice a day with fluoride toothpaste, and floss once a day. Good oral hygiene prevents tooth decay and gum disease. The problems can be painful, unattractive, and can cause other health problems. Visit your dentist for a routine oral and dental check up and preventive care every 6-12 months.   Look at your skin regularly.  Use a mirror to look at your back. Notify your caregivers of changes in moles, especially if there are changes in shapes, colors, a size larger than a pencil eraser, an irregular border, or development of new  moles.  Safety:  Use seatbelts 100% of the time, whether driving or as a passenger.  Use safety devices such as hearing protection if you work in environments with loud noise or significant background noise.  Use safety glasses when doing any work that could send debris in to the eyes.  Use a helmet if you ride a bike or motorcycle.  Use appropriate safety gear for contact sports.  Talk to your caregiver about gun safety.  Use sunscreen with a SPF (or skin protection factor) of 15 or greater.  Lighter skinned people are at a greater risk of skin cancer. Don't forget to also wear sunglasses in order to protect your eyes from too much damaging sunlight. Damaging sunlight can accelerate cataract formation.   Practice safe sex. Use condoms. Condoms are used for birth control and to help reduce the spread of sexually transmitted infections (or STIs).   Some of the STIs are gonorrhea (the clap), chlamydia, syphilis, trichomonas, herpes, HPV (human papilloma virus) and HIV (human immunodeficiency virus) which causes AIDS. The herpes, HIV and HPV are viral illnesses that have no cure. These can result in disability, cancer and death.   Keep carbon monoxide and smoke detectors in your home functioning at all times. Change the batteries every 6 months or use a model that plugs into the wall.   Vaccinations:  Stay up to date with your tetanus shots and other required immunizations. You should have a booster for tetanus every 10 years. Be sure to get your flu shot every year, since 5%-20% of the [place] population comes down with the flu. The flu vaccine changes each year, so being vaccinated once is not enough. Get your shot in the fall, before the flu season peaks.   Other vaccines to consider:  Human Papilloma Virus or HPV causes cancer of the cervix, and other infections that can be transmitted from person to person. There is a vaccine for HPV, and females should get immunized between the ages of 10 and 34. It requires a series of 3 shots.   Pneumococcal vaccine to protect against certain types of pneumonia.  This is normally recommended for adults age 29 or older.  However, adults younger than 32 years old with certain underlying conditions such as diabetes, heart or lung disease should also receive the vaccine.  Shingles vaccine to protect against Varicella Zoster if you are older than age 65, or younger than 32 years old with certain underlying illness.  Hepatitis A vaccine to protect against a form of infection of the liver by a virus acquired from food.  Hepatitis B vaccine to protect against a form of infection of the liver by a virus acquired from blood or body fluids, particularly if you work in health care.  If you plan to travel internationally, check with your local health department for specific vaccination recommendations.  Cancer  Screening:  Breast cancer screening is essential to preventive care for women. All women age 66 and older should perform a breast self-exam every month. At age 46 and older, women should have their caregiver complete a breast exam each year. Women at ages 65 and older should have a mammogram (x-ray film) of the breasts. Your caregiver can discuss how often you need mammograms.    Cervical cancer screening includes taking a Pap smear (sample of cells examined under a microscope) from the cervix (end of the uterus). It also includes testing for HPV (Human Papilloma Virus, which can cause cervical cancer). Screening and a  pelvic exam should begin at age 20, or 3 years after a woman becomes sexually active. Screening should occur every year, with a Pap smear but no HPV testing, up to age 79. After age 38, you should have a Pap smear every 3 years with HPV testing, if no HPV was found previously.   Most routine colon cancer screening begins at the age of 5. On a yearly basis, doctors may provide special easy to use take-home tests to check for hidden blood in the stool. Sigmoidoscopy or colonoscopy can detect the earliest forms of colon cancer and is life saving. These tests use a small camera at the end of a tube to directly examine the colon. Speak to your caregiver about this at age 66, when routine screening begins (and is repeated every 5 years unless early forms of pre-cancerous polyps or small growths are found).

## 2015-06-10 NOTE — Addendum Note (Signed)
Addended by: Real Cons on: 06/10/2015 09:42 AM   Modules accepted: Orders

## 2015-06-10 NOTE — Progress Notes (Signed)
Subjective:   HPI  Kathy [name] is a 34 y.o. female who presents for a complete physical.  New patient today accompanied by mother.   Preventative care: Last ophthalmology visit: sees yearly, [facility] Last dental visit: regularly, Dr. Tressie Ellis, [facility] Last mammogram:never Last gynecological exam: never had pap smear  Prior vaccinations: TD or Tdap: ? Influenza: hasn't usually gotten this  Concerns: Hx/o HTN, doesn't check BP.  Was on medication prior, but stopped it as it wasn't working, was getting side effects including SOB and dry cough.  A different BP medication gave her palpitations.   Then started having to take medication for side effects.    Gyn hx/o - no prior pregnancy.  Not currently sexually active.   Last sexual activity was 2009.   Has had STD test in the past.  No concern or desire for OCPs.  Reviewed their medical, surgical, family, social, medication, and allergy history and updated chart as appropriate.  Past Medical History  Diagnosis Date  . Environmental allergies   . Hypertension     age 72 yo  . Obesity   . Wears glasses   . Chest pain 2010    hospitalization and eval    Past Surgical History  Procedure Laterality Date  . No past surgeries  06/2015    History   Social History  . Marital Status: Single    Spouse Name: N/A  . Number of Children: N/A  . Years of Education: N/A   Occupational History  . Not on file.   Social History Main Topics  . Smoking status: Never Smoker   . Smokeless tobacco: Not on file  . Alcohol Use: No  . Drug Use: No  . Sexual Activity: No   Other Topics Concern  . Not on file   Social History Narrative   Lives with mother.   Works as Research scientist (medical) at [employer], and Public librarian at [employer].  Exercise - goes to [employer], treadmill and weights, goes 2-3 times per week.   Diet - consistent with 3 meals, but sometimes could be better.  No significant other. No children.     Family  History  Problem Relation Age of Onset  . Hypertension Mother   . Diabetes Mother   . Other Father     died of unknown causes  . Hypertension Father   . Asthma Sister   . Cancer Maternal Aunt     cervical  . Heart disease Maternal Grandmother   . Diabetes Maternal Grandmother   . Hypertension Maternal Grandmother   . Diabetes Maternal Grandfather     No current outpatient prescriptions on file.  Allergies  Allergen Reactions  . Kiwi Extract Nausea And Vomiting  . Shrimp [Shellfish Allergy] Swelling     Review of Systems Constitutional: -fever, -chills, -sweats, -unexpected weight change, -decreased appetite, -fatigue Allergy: -sneezing, -itching, -congestion Dermatology: -changing moles, --rash, -lumps ENT: -runny nose, -ear pain, -sore throat, -hoarseness, -sinus pain, -teeth pain, - ringing in ears, -hearing loss, -nosebleeds Cardiology: -chest pain, -palpitations, -swelling, -difficulty breathing when lying flat, -waking up short of breath Respiratory: -cough, -shortness of breath, -difficulty breathing with exercise or exertion, -wheezing, -coughing up blood Gastroenterology: -abdominal pain, -nausea, -vomiting, -diarrhea, -constipation, -blood in stool, -changes in bowel movement, -difficulty swallowing or eating Hematology: -bleeding, -bruising  Musculoskeletal: -joint aches, -muscle aches, -joint swelling, -back pain, -neck pain, -cramping, -changes in gait Ophthalmology: denies vision changes, eye redness, itching, discharge Urology: -burning with urination, -  difficulty urinating, -blood in urine, -urinary frequency, -urgency, -incontinence Neurology: -headache, -weakness, -tingling, -numbness, -memory loss, -falls, -dizziness Psychology: -depressed mood, -agitation, -sleep problems     Objective:   Physical Exam  BP 110/64 mmHg  Pulse 76  Temp(Src) 97.9 F (36.6 C) (Oral)  Resp 16  Ht  (1.651 m)  Wt 314 lb 6.4 oz (142.611 kg)  BMI 52.32 kg/m2  LMP  05/20/2015  General appearance: alert, no distress, WD/WN, obese AA female Skin: few scattered benign appearing macules, no worrisome lesions.    HEENT: normocephalic, conjunctiva/corneas normal, sclerae anicteric, PERRLA, EOMi, nares patent, no discharge or erythema, pharynx normal Oral cavity: MMM, tongue normal, teeth in good repair Neck: supple, no lymphadenopathy, no thyromegaly, no masses, normal ROM, no bruits Chest: non tender, normal shape and expansion Heart: RRR, normal S1, S2, no murmurs Lungs: CTA bilaterally, no wheezes, rhonchi, or rales Abdomen: +bs, soft, non tender, non distended, no masses, no hepatomegaly, no splenomegaly, no bruits Back: non tender, normal ROM, no scoliosis Musculoskeletal: upper extremities non tender, no obvious deformity, normal ROM throughout, lower extremities non tender, no obvious deformity, normal ROM throughout Extremities: no edema, no cyanosis, no clubbing Pulses: 2+ symmetric, upper and lower extremities, normal cap refill Neurological: alert, oriented x 3, CN2-12 intact, strength normal upper extremities and lower extremities, sensation normal throughout, DTRs 2+ throughout, no cerebellar signs, gait normal Psychiatric: normal affect, behavior normal, pleasant  Breast: nontender, no masses or lumps, no skin changes, no nipple discharge or inversion, no axillary lymphadenopathy Gyn: Normal external genitalia without lesions, vagina with normal mucosa, cervix without lesions, no cervical motion tenderness, no abnormal vaginal discharge.  Uterus and adnexa not enlarged, nontender, no masses.  Pap performed.  Exam chaperoned by nurse. Rectal: deferred    Assessment and Plan :    Encounter Diagnoses  Name Primary?  . Encounter for health maintenance examination in adult Yes  . Morbid obesity   . Essential hypertension   . Screening for cervical cancer   . Screen for STD (sexually transmitted disease)   . Need for TD vaccine   . Vaccine  counseling     Physical exam - discussed healthy lifestyle, diet, exercise, preventative care, vaccinations, and addressed their concerns.  Handout given. Counseled on the Td (tetanus, diptheria) vaccine.  Vaccine information sheet given. Td vaccine given after consent obtained. Advised yearly flu vaccine Discussed weight, diet, exercise, and working on weight loss First pap smear sent today  Specific recommendations today include Exercise most days per week, preferably at least 30 minutes every day See your eye doctor yearly for routine vision care. See your dentist yearly for routine dental care including hygiene visits twice yearly. Return yearly for physical and pap smear We will call with lab results  Vaccine recommendations: Return in late August for flu vaccine We updated your tetanus diptheria vaccine today  Follow-up pending labs

## 2015-06-11 ENCOUNTER — Other Ambulatory Visit: Payer: Self-pay

## 2015-06-11 DIAGNOSIS — R638 Other symptoms and signs concerning food and fluid intake: Secondary | ICD-10-CM

## 2015-06-11 LAB — HIV ANTIBODY (ROUTINE TESTING W REFLEX): HIV: NONREACTIVE

## 2015-06-11 LAB — TSH: TSH: 1.573 u[IU]/mL (ref 0.350–4.500)

## 2015-06-11 LAB — CYTOLOGY - PAP

## 2015-06-11 LAB — RPR

## 2015-09-15 ENCOUNTER — Ambulatory Visit: Payer: BLUE CROSS/BLUE SHIELD | Admitting: Medical

## 2016-01-05 ENCOUNTER — Encounter: Payer: Self-pay | Admitting: Medical

## 2016-01-05 ENCOUNTER — Ambulatory Visit (INDEPENDENT_AMBULATORY_CARE_PROVIDER_SITE_OTHER): Payer: BLUE CROSS/BLUE SHIELD | Admitting: Medical

## 2016-01-05 VITALS — BP 182/140 | HR 89 | Temp 99.0°F | Wt 322.0 lb

## 2016-01-05 DIAGNOSIS — I10 Essential (primary) hypertension: Secondary | ICD-10-CM | POA: Insufficient documentation

## 2016-01-05 DIAGNOSIS — J0110 Acute frontal sinusitis, unspecified: Secondary | ICD-10-CM

## 2016-01-05 DIAGNOSIS — R0683 Snoring: Secondary | ICD-10-CM | POA: Diagnosis not present

## 2016-01-05 DIAGNOSIS — I160 Hypertensive urgency: Secondary | ICD-10-CM | POA: Diagnosis not present

## 2016-01-05 DIAGNOSIS — R0681 Apnea, not elsewhere classified: Secondary | ICD-10-CM | POA: Diagnosis not present

## 2016-01-05 LAB — POCT URINALYSIS DIPSTICK
BILIRUBIN UA: NEGATIVE
Blood, UA: NEGATIVE
GLUCOSE UA: NEGATIVE
Ketones, UA: NEGATIVE
LEUKOCYTES UA: NEGATIVE
NITRITE UA: NEGATIVE
PH UA: 7.5
Spec Grav, UA: 1.015
Urobilinogen, UA: NEGATIVE

## 2016-01-05 LAB — BASIC METABOLIC PANEL
BUN: 11 mg/dL (ref 7–25)
CHLORIDE: 104 mmol/L (ref 98–110)
CO2: 26 mmol/L (ref 20–31)
Calcium: 7.9 mg/dL — ABNORMAL LOW (ref 8.6–10.2)
Creat: 0.74 mg/dL (ref 0.50–1.10)
Glucose, Bld: 95 mg/dL (ref 65–99)
Potassium: 3.6 mmol/L (ref 3.5–5.3)
Sodium: 137 mmol/L (ref 135–146)

## 2016-01-05 MED ORDER — AMLODIPINE BESYLATE 10 MG PO TABS: 10.0000 mg | ORAL_TABLET | Freq: Every day | ORAL | Status: DC

## 2016-01-05 MED ORDER — AMOXICILLIN 875 MG PO TABS: 875.0000 mg | ORAL_TABLET | Freq: Two times a day (BID) | ORAL | Status: DC

## 2016-01-05 MED ORDER — LOSARTAN POTASSIUM-HCTZ 50-12.5 MG PO TABS: 1.0000 | ORAL_TABLET | Freq: Every day | ORAL | Status: DC

## 2016-01-05 NOTE — Progress Notes (Addendum)
Subjective: Chief Complaint  Patient presents with  . conjestion    said she feels dried up like it stuck. been gargling salt water. started 2 weeks ago but is getting worse.    "Here for not feeling well, sinus and allergies"  accompanied by mother today.   Has some dry mucous in throat.   Been feeling this way 2 weeks ago, gotten worse over time.  When the dogwood trees bloom she tends to get congestion.   Has ear pressure, feels drainage down throat, has had sore throat but not now, some sinus headache.   Getting some production from nose, yellow, sometimes burgundy colored.    Denies cough, NVD, no sneezing.    Feels a little SOB x a few days.    Denies chest pain.  Has had some ankle swelling on the right.  No blurred vision.  Mom notes sometime gasping in her sleep.    She snores.  Mom thinks she quits breathing in her sleep.  No other aggravating or relieving factors. No other complaint.  She notes hx/o hypertension diagnosed age 35yo.  Was on medication for some years, but reportedly was able to come off BP medication with exercise changes. Hasn't been on medication for years.  Was exercising regularly back in the fall 2016 when I saw her last.    Past Medical History  Diagnosis Date  . Environmental allergies   . Hypertension     age 35 yo  . Obesity   . Wears glasses   . Chest pain 2010    hospitalization and eval   ROS as in subjective  Objective: BP 182/140 mmHg  Pulse 89  Temp(Src) 99 F (37.2 C) (Tympanic)  Wt 322 lb (146.058 kg)  SpO2 97%  LMP 12/08/2015  General appearance: alert, no distress, WD/WN, morbidly obese AA female HEENT: normocephalic, sclerae anicteric,mild frontal sinus tenderness, TMs pearly, nares patent, no discharge or erythema, pharynx normal Oral cavity: MMM, no lesions Neck: supple, no lymphadenopathy, no thyromegaly, no masses Heart: RRR, normal S1, S2, no murmurs Lungs: CTA bilaterally, no wheezes, rhonchi, or rales Pulses: 2+ symmetric,  upper and lower extremities, normal cap refill Ext: 1+ nonpitting edema bilat   Adult ECG Report  Indication: elevated BP  Rate: 45 bpm  Rhythm: normal sinus rhythm  QRS Axis: -7 degrees  PR Interval: 150ms  QRS Duration: 92ms  QTc: 492ms  Conduction Disturbances: atrial enlargement, LVH and T wave inversions V4-6 and I and II  Other Abnormalities: none  Patient's cardiac risk factors are: obesity (BMI >= 30 kg/m2) and sedentary lifestyle.  EKG comparison: none  Narrative Interpretation: abnormal EKG, strain pattern     Assessment Encounter Diagnoses  Name Primary?  . Acute frontal sinusitis, recurrence not specified Yes  . Hypertension, uncontrolled   . Morbid obesity, unspecified obesity type (HCC)   . Snoring   . Witnessed apneic spells     Plan Sinusitis - begin amoxicillin, rest, hydrate well  HTN - discussed findings.  She has had uncontrolled hypertension at least from 2013 per chart records. Looking back we must have gotten random good reading or inaccurate BP reading back last fall at her physical.  Begin back on medication, lab today, and f/u 1wk, sooner prn.   Discussed case with supervising physician , Dr. Susann GivensLalonde.  Refer for sleep study

## 2016-01-28 ENCOUNTER — Telehealth: Payer: Self-pay | Admitting: Medical

## 2016-01-28 NOTE — Telephone Encounter (Signed)
Please call  ? Side effects from amlodipine    Water retention, legs and feet really swollen and hurt, going on for about a week. She did take dose this morning and then searched side effects on the Internet. She does not plan to take any more  Please call

## 2016-01-28 NOTE — Telephone Encounter (Signed)
She was due back 1 wk from last visit.  Get her in ASAP!  She has very high uncontrolled hypertension.

## 2016-01-29 NOTE — Telephone Encounter (Signed)
Left message for pt

## 2016-02-02 NOTE — Telephone Encounter (Signed)
Called again left message for pt to call pt went straight to voicemail

## 2016-02-18 ENCOUNTER — Encounter: Payer: Self-pay | Admitting: Medical

## 2016-02-18 ENCOUNTER — Ambulatory Visit (INDEPENDENT_AMBULATORY_CARE_PROVIDER_SITE_OTHER): Payer: BLUE CROSS/BLUE SHIELD | Admitting: Medical

## 2016-02-18 VITALS — BP 170/120 | HR 78 | Wt 329.0 lb

## 2016-02-18 DIAGNOSIS — Z9119 Patient's noncompliance with other medical treatment and regimen: Secondary | ICD-10-CM | POA: Diagnosis not present

## 2016-02-18 DIAGNOSIS — R9431 Abnormal electrocardiogram [ECG] [EKG]: Secondary | ICD-10-CM

## 2016-02-18 DIAGNOSIS — Z91199 Patient's noncompliance with other medical treatment and regimen due to unspecified reason: Secondary | ICD-10-CM | POA: Insufficient documentation

## 2016-02-18 DIAGNOSIS — E669 Obesity, unspecified: Secondary | ICD-10-CM | POA: Insufficient documentation

## 2016-02-18 DIAGNOSIS — I10 Essential (primary) hypertension: Secondary | ICD-10-CM | POA: Insufficient documentation

## 2016-02-18 NOTE — Progress Notes (Signed)
Subjective: Chief Complaint  Patient presents with  . Foot Swelling    and ankles on both legs. has not checked bp at home   Here for f/u.  I saw her 01/28/16 for sinusitis, but pressures were very high. We had her restart medication and advised 1 week f/u.   I had strongly advised her and her mother that was with her to return in 1 week given high BP and abnormal EKG.  unfortunately she notes that she didn't tolerate the BP medications, got frustrated, and decided to stop her medication.    She notes hx/o hypertension since teenage years.   She notes being on several medications over the years, but having trouble tolerating medications.   She notes that any medication that "wipes her out" or causes fatigue or sluggishness, she stops them.  She says she wont take any BP medication that makes her feel bad.    She recalls prior problems with Atenolol, Amlodipine, HCTZ, Losartan HCT, Diltiazem, Lisinopril.  She is not sure if she has ever been on Lasix, Verapamil, Clonidine, Methyldopa.  She is scheduled for sleep study 04/09/16 at my request.  She has no other c/o.  Past Medical History  Diagnosis Date  . Environmental allergies   . Hypertension     age 35 yo  . Obesity   . Wears glasses   . Chest pain 2010    hospitalization and eval   Family History  Problem Relation Age of Onset  . Hypertension Mother   . Diabetes Mother   . Other Father     died of unknown causes  . Hypertension Father   . Asthma Sister   . Cancer Maternal Aunt     cervical  . Heart disease Maternal Grandmother   . Diabetes Maternal Grandmother   . Hypertension Maternal Grandmother   . Diabetes Maternal Grandfather     Objective: BP 170/120 mmHg  Pulse 78  Wt 329 lb (149.233 kg)  LMP 01/28/2016  BP Readings from Last 3 Encounters:  02/18/16 170/120  01/05/16 182/140  06/10/15 110/64   Wt Readings from Last 3 Encounters:  02/18/16 329 lb (149.233 kg)  01/05/16 322 lb (146.058 kg)  06/10/15 314 lb  6.4 oz (142.611 kg)   General appearance: alert, no distress, WD/WN, morbidly obese AA female HEENT: normocephalic, sclerae anicteric,mild frontal sinus tenderness, TMs pearly, nares patent, no discharge or erythema, pharynx normal Oral cavity: MMM, no lesions Neck: supple, no lymphadenopathy, no thyromegaly, no masses Heart: RRR, normal S1, S2, no murmurs Lungs: CTA bilaterally, no wheezes, rhonchi, or rales Pulses: 2+ symmetric, upper and lower extremities, normal cap refill Ext: 1+ nonpitting edema bilat   Assessment Encounter Diagnoses  Name Primary?  . Accelerated hypertension Yes  . Obesity   . Noncompliance   . Nonspecific abnormal electrocardiogram (ECG) (EKG)     Plan After speaking with her, I suspect she is unwilling to use any medication I recommend for high blood pressure.  She reports wanting to try Herbal Life supplements instead.   Similar to last visit, I reiterated the severity of her elevated BPs, possible serious consequences of uncontrolled hypertension, goals of therapy, and recommended she given any recommended medication a few weeks try before giving up on it completley.   We discussed other non medication ways to try and get better control over her blood pressure.  given her noncompliance and recent EKG changes, will refer to cardiology.  She will see cardiology tomorrow.  C/t plan to have  sleep study as scheduled.

## 2016-02-19 DIAGNOSIS — I119 Hypertensive heart disease without heart failure: Secondary | ICD-10-CM | POA: Diagnosis not present

## 2016-02-19 DIAGNOSIS — Z6841 Body Mass Index (BMI) 40.0 and over, adult: Secondary | ICD-10-CM | POA: Diagnosis not present

## 2016-02-19 DIAGNOSIS — R9431 Abnormal electrocardiogram [ECG] [EKG]: Secondary | ICD-10-CM | POA: Diagnosis not present

## 2016-02-25 DIAGNOSIS — R9431 Abnormal electrocardiogram [ECG] [EKG]: Secondary | ICD-10-CM | POA: Diagnosis not present

## 2016-02-25 DIAGNOSIS — I10 Essential (primary) hypertension: Secondary | ICD-10-CM | POA: Diagnosis not present

## 2016-03-04 DIAGNOSIS — R9431 Abnormal electrocardiogram [ECG] [EKG]: Secondary | ICD-10-CM | POA: Diagnosis not present

## 2016-03-04 DIAGNOSIS — I119 Hypertensive heart disease without heart failure: Secondary | ICD-10-CM | POA: Diagnosis not present

## 2016-03-11 DIAGNOSIS — I119 Hypertensive heart disease without heart failure: Secondary | ICD-10-CM | POA: Diagnosis not present

## 2016-03-15 ENCOUNTER — Encounter: Payer: Self-pay | Admitting: Medical

## 2016-03-18 DIAGNOSIS — I119 Hypertensive heart disease without heart failure: Secondary | ICD-10-CM | POA: Diagnosis not present

## 2016-03-18 DIAGNOSIS — R9431 Abnormal electrocardiogram [ECG] [EKG]: Secondary | ICD-10-CM | POA: Diagnosis not present

## 2016-04-07 ENCOUNTER — Encounter: Payer: Self-pay | Admitting: Medical

## 2016-04-09 ENCOUNTER — Ambulatory Visit (HOSPITAL_BASED_OUTPATIENT_CLINIC_OR_DEPARTMENT_OTHER): Payer: BLUE CROSS/BLUE SHIELD | Attending: Medical | Admitting: Internal Medicine

## 2016-04-09 VITALS — Ht 63.0 in | Wt 310.0 lb

## 2016-04-09 DIAGNOSIS — G4733 Obstructive sleep apnea (adult) (pediatric): Secondary | ICD-10-CM | POA: Insufficient documentation

## 2016-04-09 DIAGNOSIS — E669 Obesity, unspecified: Secondary | ICD-10-CM | POA: Diagnosis not present

## 2016-04-09 DIAGNOSIS — G4736 Sleep related hypoventilation in conditions classified elsewhere: Secondary | ICD-10-CM | POA: Insufficient documentation

## 2016-04-09 DIAGNOSIS — Z79899 Other long term (current) drug therapy: Secondary | ICD-10-CM | POA: Diagnosis not present

## 2016-04-09 DIAGNOSIS — R0683 Snoring: Secondary | ICD-10-CM | POA: Insufficient documentation

## 2016-04-09 DIAGNOSIS — Z6841 Body Mass Index (BMI) 40.0 and over, adult: Secondary | ICD-10-CM | POA: Insufficient documentation

## 2016-04-20 DIAGNOSIS — R9431 Abnormal electrocardiogram [ECG] [EKG]: Secondary | ICD-10-CM | POA: Diagnosis not present

## 2016-04-20 DIAGNOSIS — I119 Hypertensive heart disease without heart failure: Secondary | ICD-10-CM | POA: Diagnosis not present

## 2016-04-21 ENCOUNTER — Telehealth: Payer: Self-pay | Admitting: Medical

## 2016-04-21 NOTE — Telephone Encounter (Signed)
I reviewed her recent cardiology notes.  Glad to see her BP is much better, and she is tolerating the medication per cardiology notes.

## 2016-05-01 DIAGNOSIS — G4733 Obstructive sleep apnea (adult) (pediatric): Secondary | ICD-10-CM | POA: Diagnosis not present

## 2016-05-01 NOTE — Procedures (Signed)
     Patient Name: Kathy [name], Kathy [name] Date: 04/09/2016 Gender: Female D.O.B: [birth year] Age (years): 35 Referring Provider: Crosby Oysteravid Tysinger Height (inches): 64 Interpreting Physician: Jetty Duhamellinton Matthew Cina MD, ABSM Weight (lbs): 310 RPSGT: Rolene ArbourMcConnico, Yvonne BMI: 53 MRN: [id number] Neck Size: 17.00 CLINICAL INFORMATION [name] Type: NPSG Indication for [name]: Obesity, OSA, Snoring Epworth Sleepiness Score: 9  [name] TECHNIQUE As per the AASM Manual for the Scoring of Sleep and Associated Events v2.3 (April 2016) with a hypopnea requiring 4% desaturations. The channels recorded and monitored were frontal, central and occipital EEG, electrooculogram (EOG), submentalis EMG (chin), nasal and oral airflow, thoracic and abdominal wall motion, anterior tibialis EMG, snore microphone, electrocardiogram, and pulse oximetry.  MEDICATIONS Patient's medications include: charted for review [name] : CLONIDINE, NIFEDIPINE.  SLEEP ARCHITECTURE The [name] was initiated at 10:45:40 PM and ended at 5:08:51 AM. Sleep onset time was 35.9 minutes and the sleep efficiency was 85.0%. The total sleep time was 325.8 minutes. Stage REM latency was 71.5 minutes. The patient spent 2.15% of the night in stage N1 sleep, 80.05% in stage N2 sleep, 0.00% in stage N3 and 17.80% in REM. Alpha intrusion was absent. Supine sleep was 44.44%.  RESPIRATORY PARAMETERS The overall apnea/hypopnea index (AHI) was 14.0 per hour. There were 4 total apneas, including 4 obstructive, 0 central and 0 mixed apneas. There were 72 hypopneas and 15 RERAs. The AHI during Stage REM sleep was 30.0 per hour. AHI while supine was 22.4 per hour. The mean oxygen saturation was 93.05%. The minimum SpO2 during sleep was 74.00%. Soft snoring was noted during this [name].  CARDIAC DATA The 2 lead EKG demonstrated sinus rhythm. The mean heart rate was 62.26 beats per minute.  Other EKG findings include: None.  LEG MOVEMENT DATA The total PLMS were 0 with a resulting PLMS index of 0.00. Associated arousal with leg movement index was 0.0 .  IMPRESSIONS - Mild obstructive sleep apnea occurred during this [name] (AHI = 14.0/h). - There were not enough early events to meet protocol requirement for split CPAP titration on this [name] - No significant central sleep apnea occurred during this [name] (CAI = 0.0/h). - Severe oxygen desaturation was noted during this [name] (Min O2 = 74.00%). - The patient snored with Soft snoring volume. - No cardiac abnormalities were noted during this [name]. - Clinically significant periodic limb movements did not occur during sleep. No significant associated arousals.  DIAGNOSIS - Obstructive Sleep Apnea (327.23 [G47.33 ICD-10]) - Nocturnal Hypoxemia (327.26 [G47.36 ICD-10])  RECOMMENDATIONS - Therapeutic CPAP titration to determine optimal pressure required to alleviate sleep disordered breathing. - Positional therapy avoiding supine position during sleep. - Avoid alcohol, sedatives and other CNS depressants that may worsen sleep apnea and disrupt normal sleep architecture. - Sleep hygiene should be reviewed to assess factors that may improve sleep quality. - Weight management and regular exercise should be initiated or continued if appropriate.  Waymon BudgeYOUNG,Dontavis Tschantz D Diplomate, American Board of Sleep Medicine  ELECTRONICALLY SIGNED ON:  05/01/2016, 9:44 AM [facility] PH: [phone]   FX: [phone] ACCREDITED BY THE AMERICAN ACADEMY OF SLEEP MEDICINE

## 2016-05-04 ENCOUNTER — Telehealth: Payer: Self-pay | Admitting: Medical

## 2016-05-04 NOTE — Telephone Encounter (Signed)
Have her come in to reviewed sleep study and consider possible interventions.

## 2016-05-05 NOTE — Telephone Encounter (Signed)
LMTCB

## 2016-05-06 NOTE — Telephone Encounter (Signed)
Pt is aware and discussed that Vincenza HewsShane has reviewed her cardiologist notes. Appt is made

## 2016-05-10 ENCOUNTER — Encounter: Payer: Self-pay | Admitting: Medical

## 2016-05-10 ENCOUNTER — Ambulatory Visit (INDEPENDENT_AMBULATORY_CARE_PROVIDER_SITE_OTHER): Payer: BLUE CROSS/BLUE SHIELD | Admitting: Medical

## 2016-05-10 VITALS — BP 172/110 | HR 69 | Wt 326.0 lb

## 2016-05-10 DIAGNOSIS — G478 Other sleep disorders: Secondary | ICD-10-CM | POA: Diagnosis not present

## 2016-05-10 DIAGNOSIS — G4733 Obstructive sleep apnea (adult) (pediatric): Secondary | ICD-10-CM

## 2016-05-10 DIAGNOSIS — I10 Essential (primary) hypertension: Secondary | ICD-10-CM

## 2016-05-10 DIAGNOSIS — M79671 Pain in right foot: Secondary | ICD-10-CM

## 2016-05-10 DIAGNOSIS — E669 Obesity, unspecified: Secondary | ICD-10-CM

## 2016-05-10 DIAGNOSIS — R0681 Apnea, not elsewhere classified: Secondary | ICD-10-CM

## 2016-05-10 DIAGNOSIS — Z9119 Patient's noncompliance with other medical treatment and regimen: Secondary | ICD-10-CM | POA: Diagnosis not present

## 2016-05-10 DIAGNOSIS — R0683 Snoring: Secondary | ICD-10-CM | POA: Diagnosis not present

## 2016-05-10 DIAGNOSIS — Z91199 Patient's noncompliance with other medical treatment and regimen due to unspecified reason: Secondary | ICD-10-CM

## 2016-05-10 DIAGNOSIS — G4730 Sleep apnea, unspecified: Secondary | ICD-10-CM

## 2016-05-10 NOTE — Progress Notes (Signed)
Subjective: Chief Complaint  Patient presents with  . Follow-up    on sleep study. no problems or concerns. is on more medications then is listed but does not remmeber names   Here for f/u on sleep study.     Since last visit has seen Dr. Jacinto HalimGanji, had BP medications modified.   However, she notes that she ran out of a few of her medications as she can't afford to pick them up until Friday of this week.   She sees Dr. Jacinto HalimGanji now for hypertension management.   At last visit we discussed witnessed apnea events, snoring, and the fact she has uncontrolled hypertension and morbid obesity.  exercise - does treadmill 5 days per week, does hill interval.    Has ongoing pain in right ankle, worse first in the morning.  Wears shoes most of the time, but has on sandals today.   Doesn't wear heels.  Diet - eating some fast food, was on vacation last week.  But in general trying to eat healthier.     Past Medical History  Diagnosis Date  . Environmental allergies   . Hypertension     age 35 yo  . Obesity   . Wears glasses   . Chest pain 2010    hospitalization and eval   ROS as in subjective   Objective: BP 172/110 mmHg  Pulse 69  Wt 326 lb (147.873 kg)  LMP 04/26/2016  Wt Readings from Last 3 Encounters:  05/10/16 326 lb (147.873 kg)  04/09/16 310 lb (140.615 kg)  02/18/16 329 lb (149.233 kg)   Gen: wd, wn, nad Skin: unremarkable Heart RRR, normal s1, s2, no murmurs Lungs clear Ext: 1+ nonpitting edema bilat ankles Nontender feet and ankles today, normal ankle and foot ROM, no deformity    Assessment: Encounter Diagnoses  Name Primary?  . Hypertension, uncontrolled Yes  . Obesity   . Noncompliance   . Morbid obesity, unspecified obesity type (HCC)   . Snoring   . Witnessed apneic spells   . OSA (obstructive sleep apnea)   . Sleep hypopnea   . Right foot pain     Plan: Reviewed sleep study, showing oxygen lowest in the night 74% but mean was 93%, AHI 14/h, but during  REM sleep 30 /h.  Discussed treatment options and recommendations.  Advised weight loss efforts, raising head of bed, consider mouthpiece for sleep apnea, CPAP. She wants to check insurance first regarding coverage.  She will let me know  HTN - gave samples of Bystolic but f/u with Dr. Jacinto HalimGanji if medications too expensive  Work on weight loss through healthy diet, exercise  Right foot pain reported - discussed possible differential, edema, plantar fascitis, body mechanics and shoe wear.  Gave recommendations to help with this.  F/u if not improving.

## 2016-06-02 ENCOUNTER — Encounter: Payer: Self-pay | Admitting: Medical

## 2016-06-02 ENCOUNTER — Ambulatory Visit (INDEPENDENT_AMBULATORY_CARE_PROVIDER_SITE_OTHER): Payer: BLUE CROSS/BLUE SHIELD | Admitting: Medical

## 2016-06-02 VITALS — BP 156/100 | HR 68 | Wt 325.0 lb

## 2016-06-02 DIAGNOSIS — G4733 Obstructive sleep apnea (adult) (pediatric): Secondary | ICD-10-CM

## 2016-06-02 DIAGNOSIS — N926 Irregular menstruation, unspecified: Secondary | ICD-10-CM | POA: Diagnosis not present

## 2016-06-02 DIAGNOSIS — Z9119 Patient's noncompliance with other medical treatment and regimen: Secondary | ICD-10-CM

## 2016-06-02 DIAGNOSIS — I10 Essential (primary) hypertension: Secondary | ICD-10-CM | POA: Diagnosis not present

## 2016-06-02 DIAGNOSIS — Z91199 Patient's noncompliance with other medical treatment and regimen due to unspecified reason: Secondary | ICD-10-CM

## 2016-06-02 MED ORDER — ATENOLOL-CHLORTHALIDONE 100-25 MG PO TABS: 1.0000 | ORAL_TABLET | Freq: Every day | ORAL | 2 refills | Status: DC

## 2016-06-02 MED ORDER — CLONIDINE HCL 0.1 MG PO TABS: 0.1000 mg | ORAL_TABLET | Freq: Two times a day (BID) | ORAL | 11 refills | Status: DC

## 2016-06-02 MED ORDER — SPIRONOLACTONE 50 MG PO TABS: 50.0000 mg | ORAL_TABLET | Freq: Every day | ORAL | 2 refills | Status: DC

## 2016-06-02 MED ORDER — NIFEDIPINE ER OSMOTIC RELEASE 60 MG PO TB24: 60.0000 mg | ORAL_TABLET | Freq: Every day | ORAL | 2 refills | Status: DC

## 2016-06-02 NOTE — Progress Notes (Signed)
Subjective: Chief Complaint  Patient presents with  . Advice Only    is not sexually active but missed a period. bp has been high when in office and wants to discuss medication    Kathy [name] is a 35 y.o. female who presents for BP recheck on missed period  She is not sexually active, no sexual activity in 8 years.   She notes since starting menarche age 13yo has been very regular with periods, not particularly heavy or cramping.   However, her cycle was a little delayed in June, LMP 04/26/16.  She notes no period in July, and in the past year does get 2 cycles in a month from time to time but mostly regular periods.   Mother has hx/o dysplasia of cervix and sister with fibroids.  Not on OCPs.  She has been trying to get some exercise, trying to eat healthier.   She is on spironolactone that can't affect periods.   Since last visit she is looking into mouthpiece and wedge pillow to address sleep apnea.  Can't afford CPAP  HTN - not compliant.  She notes she cant afford the medications.  Suppose to be on 5 medications per last visit with Dr. Jacinto Halim.  Can't afford bystolic or clonidine patch.   Thus only taking 3 medications, and sometimes misses doses.  No chest pain, palpations, edema.    No other aggravating or relieving factors.    No other c/o.  The following portions of the patient's history were reviewed and updated as appropriate: allergies, current medications, past family history, past medical history, past social history, past surgical history and problem list.  ROS Otherwise as in subjective above  Objective: Physical Exam BP (!) 156/100 Comment: checked by two CMA's  Pulse 68   Wt (!) 325 lb (147.4 kg)   LMP 04/26/2016   BMI 57.57 kg/m   General appearance: alert, no distress, WD/WN Neck: supple, no lymphadenopathy, no thyromegaly, no masses Heart: RRR, normal S1, S2, no murmurs Lungs: CTA bilaterally, no wheezes, rhonchi, or rales Abdomen: +bs, soft, non tender, non  distended, no masses, no hepatomegaly, no splenomegaly Pulses: 2+ radial pulses, 2+ pedal pulses, normal cap refill Ext: no edema   Assessment: Encounter Diagnoses  Name Primary?  . Hypertension, uncontrolled Yes  . OSA (obstructive sleep apnea)   . Morbid obesity, unspecified obesity type (HCC)   . Noncompliance   . Missed period      Plan: discussed her concerns, compliance, medication costs.   We made changes today for better costs and compliance.   Discussed goal BP, possible complications.  C/t efforts with healthy diet and exercise, weight loss efforts.   Again advised she move forward with treatment for OSA either wedge, weight loss, mouth pice and/or CPAP.   discussed monitoring periods.  No specific treatment now, but presumably the missed period is due to effects of spironolactone.   C/t spironolactone for now, and hopefully periods with stabilize within next month or 2. If not, consider gynecology consult given family hx/o fibroids and dysplasia.     Patient Instructions  Recommendations:  STOP Chlorthalidone  STOP Bystolic  STOP Clonidine path   Continue Spironolactone daily  BEGIN Atenolol/Chlorthalidone combo pill once daily  Continue Nifedipine XR  daily  BEGIN Clonidine 0.1mg  tablet twice daily    [name] was seen today for advice only.  Diagnoses and all orders for this visit:  Hypertension, uncontrolled  OSA (obstructive sleep apnea)  Morbid obesity, unspecified obesity type (  HCC)  Noncompliance  Missed period  Other orders -     atenolol-chlorthalidone (TENORETIC) 100-25 MG tablet; Take 1 tablet by mouth daily. -     cloNIDine (CATAPRES) 0.1 MG tablet; Take 1 tablet (0.1 mg total) by mouth 2 (two) times daily. -     spironolactone (ALDACTONE) 50 MG tablet; Take 1 tablet (50 mg total) by mouth daily. -     NIFEdipine (PROCARDIA XL/ADALAT-CC) 60 MG 24 hr tablet; Take 1 tablet (60 mg total) by mouth daily.  f/u 28mo, sooner prn

## 2016-06-02 NOTE — Patient Instructions (Addendum)
Recommendations:  STOP Chlorthalidone  STOP Bystolic  STOP Clonidine path   Continue Spironolactone daily  BEGIN Atenolol/Chlorthalidone combo pill once daily  Continue Nifedipine XR 60mg  daily  BEGIN Clonidine 0.1mg  tablet twice daily

## 2016-06-14 ENCOUNTER — Telehealth: Payer: Self-pay | Admitting: Medical

## 2016-06-14 NOTE — Telephone Encounter (Signed)
Pt called and stated the she was prescribed new BP meds due to cost. She is not swelling in her feet and ankles. Please advise pt. Pt can be reached at [phone] and uses [facility].

## 2016-06-15 NOTE — Telephone Encounter (Signed)
LMTCB

## 2016-06-15 NOTE — Telephone Encounter (Signed)
Not sure I understand what the question is?  I believe she had f/u visit with Dr. Jacinto HalimGanji since I last saw her within recent weeks.   If he changed anythign then she should use those recommendations.  What is the questions or concern?

## 2016-06-17 NOTE — Telephone Encounter (Signed)
LMTCB

## 2016-06-21 NOTE — Telephone Encounter (Signed)
Left message for pt to call me back 

## 2016-06-28 ENCOUNTER — Ambulatory Visit (INDEPENDENT_AMBULATORY_CARE_PROVIDER_SITE_OTHER): Payer: BLUE CROSS/BLUE SHIELD | Admitting: Medical

## 2016-06-28 ENCOUNTER — Encounter: Payer: Self-pay | Admitting: Medical

## 2016-06-28 VITALS — BP 136/98 | HR 64 | Wt 323.0 lb

## 2016-06-28 DIAGNOSIS — M2141 Flat foot [pes planus] (acquired), right foot: Secondary | ICD-10-CM | POA: Diagnosis not present

## 2016-06-28 DIAGNOSIS — M21619 Bunion of unspecified foot: Secondary | ICD-10-CM | POA: Insufficient documentation

## 2016-06-28 DIAGNOSIS — M25571 Pain in right ankle and joints of right foot: Secondary | ICD-10-CM | POA: Diagnosis not present

## 2016-06-28 DIAGNOSIS — M21969 Unspecified acquired deformity of unspecified lower leg: Secondary | ICD-10-CM | POA: Insufficient documentation

## 2016-06-28 DIAGNOSIS — I868 Varicose veins of other specified sites: Secondary | ICD-10-CM

## 2016-06-28 DIAGNOSIS — M25579 Pain in unspecified ankle and joints of unspecified foot: Secondary | ICD-10-CM | POA: Insufficient documentation

## 2016-06-28 DIAGNOSIS — M2142 Flat foot [pes planus] (acquired), left foot: Secondary | ICD-10-CM | POA: Diagnosis not present

## 2016-06-28 DIAGNOSIS — M21961 Unspecified acquired deformity of right lower leg: Secondary | ICD-10-CM

## 2016-06-28 DIAGNOSIS — M2010 Hallux valgus (acquired), unspecified foot: Secondary | ICD-10-CM | POA: Diagnosis not present

## 2016-06-28 DIAGNOSIS — I8390 Asymptomatic varicose veins of unspecified lower extremity: Secondary | ICD-10-CM

## 2016-06-28 NOTE — Telephone Encounter (Signed)
Have not been able to reach pt.

## 2016-06-28 NOTE — Progress Notes (Signed)
Subjective: Chief Complaint  Patient presents with  . right ankle pain    swelling also. has never went back down from the reaction she had to a medicaiton   Here for right ankle pain and swelling x months.  Seems to have started when she began amlodipine months ago.   Was hurting in the mornings only but lately for last 2 weeks hurting off and on all day.  Denies injury, no fall.  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   Exercise - not much since coming back from vacation in July for the past month.  Wears dress shoes at the school, wears tennis shoes at the salon.  Denies numbness, tingling, weakness.  No other aggravating or relieving factors. No other complaint.  Past Medical History:  Diagnosis Date  . Chest pain 2010   hospitalization and eval  . Environmental allergies   . Hypertension    age 35 yo  . Obesity   . Wears glasses    ROS as in subjective   Objective: BP (!) 136/98   Pulse 64   Wt (!) 323 lb (146.5 kg)   LMP 06/23/2016   BMI 57.22 kg/m   Gen: wd, wn, nad Skin: unremarkable varicose veins of right thigh laterally Pulses 2+ LE No obvious edema ankles or lower legs Obese body habitus Tender over right medial ankle and deltoid ligament Bilat bunions but she notes these are unchanged since childhood  bilat flat feet, but when walking the right foot inverts to the point that the right heel pushes laterally causing deformity of the foot.  This flattens the foot out further putting stress on the ankle Otherwise feet nontender with normal ROM    Assessment: Encounter Diagnoses  Name Primary?  Marland Kitchen. Acquired foot deformity, right Yes  . Pes planus of both feet   . Bunion of great toe, unspecified laterality   . Ankle pain, right   . Varicose veins   . Morbid obesity, unspecified obesity type (HCC)     Plan: Referral to podiatry for consult on pes planus, ankle pain, foot deformity.  Will likely need bracing or  corrective shoe wear.    Advised to use tennis shoes and not flats or dress shoes when possible.     Discussed varicose veins.  C/t efforts to lose weight, needs to get back exercising, eating healthy.

## 2016-07-02 ENCOUNTER — Ambulatory Visit: Payer: BLUE CROSS/BLUE SHIELD | Admitting: Podiatry

## 2016-07-06 DIAGNOSIS — R9431 Abnormal electrocardiogram [ECG] [EKG]: Secondary | ICD-10-CM | POA: Diagnosis not present

## 2016-07-06 DIAGNOSIS — I119 Hypertensive heart disease without heart failure: Secondary | ICD-10-CM | POA: Diagnosis not present

## 2016-07-07 ENCOUNTER — Ambulatory Visit (INDEPENDENT_AMBULATORY_CARE_PROVIDER_SITE_OTHER): Payer: BLUE CROSS/BLUE SHIELD

## 2016-07-07 ENCOUNTER — Encounter: Payer: Self-pay | Admitting: Podiatry

## 2016-07-07 ENCOUNTER — Ambulatory Visit (INDEPENDENT_AMBULATORY_CARE_PROVIDER_SITE_OTHER): Payer: BLUE CROSS/BLUE SHIELD | Admitting: Podiatry

## 2016-07-07 VITALS — BP 147/89 | HR 54 | Resp 16 | Ht 63.0 in | Wt 300.0 lb

## 2016-07-07 DIAGNOSIS — M779 Enthesopathy, unspecified: Secondary | ICD-10-CM | POA: Diagnosis not present

## 2016-07-07 DIAGNOSIS — M25571 Pain in right ankle and joints of right foot: Secondary | ICD-10-CM

## 2016-07-07 DIAGNOSIS — M21619 Bunion of unspecified foot: Secondary | ICD-10-CM | POA: Diagnosis not present

## 2016-07-07 MED ORDER — TRIAMCINOLONE ACETONIDE 10 MG/ML IJ SUSP
10.0000 mg | Freq: Once | INTRAMUSCULAR | Status: AC
  Administered 2016-07-07: 10 mg

## 2016-07-07 NOTE — Patient Instructions (Addendum)

## 2016-07-07 NOTE — Progress Notes (Signed)
   Subjective:    Patient ID: Kathy [name], female    DOB: [birth year], 35 y.o.   MRN: [id number]  HPI Chief Complaint  Patient presents with  . Ankle Pain    Right foot; both sides; medial side hurts more; pt stated, "Thought sprained ankle in Feb. 2017; pain starts in ankle and shoots to the bottom of the foot; hurts more in the morning"; Since Feb. 2017  . Foot Pain    Right foot; plantar & heel; x1 month      Review of Systems  HENT: Positive for sinus pressure and sore throat.   Musculoskeletal: Positive for arthralgias.  Allergic/Immunologic: Positive for environmental allergies and food allergies.  Neurological: Positive for dizziness.  All other systems reviewed and are negative.      Objective:   Physical Exam        Assessment & Plan:

## 2016-07-08 NOTE — Progress Notes (Signed)
Subjective:     Patient ID: Kathy [name], female   DOB: 1981-08-17, 35 y.o.   MRN: [id number]  HPI patient presents stating she's developed a lot of pain on the inside of the right ankle and the foot seems flat and she does have extreme obesity   Review of Systems  All other systems reviewed and are negative.      Objective:   Physical Exam  Constitutional: She is oriented to person, place, and time.  Cardiovascular: Intact distal pulses.   Musculoskeletal: Normal range of motion.  Neurological: She is oriented to person, place, and time.  Skin: Skin is warm.  Nursing note and vitals reviewed.  Neurovascular status intact muscle strength adequate range of motion within normal limits with patient found to have quite a bit of discomfort in the medial ankle right around the posterior tibial tendon as it comes underneath the malleolus with depression of the arch and no indication of tendon dysfunction with no indication of muscle issues     Assessment:     Probable inflammatory tendinitis right posterior tibial tendon secondary to significant flattening the arch and obesity    Plan:     H&P x-rays reviewed and careful sheath injection administered posterior tib after explaining right risk and explained probability orthotics long-term and if symptoms persist immobilization with possibility for MRI if symptoms continue to persist  X-ray report indicates that there is depression of the arch with moderate structural bunion deformity right

## 2016-07-21 ENCOUNTER — Encounter: Payer: Self-pay | Admitting: Podiatry

## 2016-07-21 ENCOUNTER — Ambulatory Visit (INDEPENDENT_AMBULATORY_CARE_PROVIDER_SITE_OTHER): Payer: BLUE CROSS/BLUE SHIELD | Admitting: Podiatry

## 2016-07-21 DIAGNOSIS — M21619 Bunion of unspecified foot: Secondary | ICD-10-CM | POA: Diagnosis not present

## 2016-07-21 DIAGNOSIS — M779 Enthesopathy, unspecified: Secondary | ICD-10-CM

## 2016-07-21 DIAGNOSIS — M25571 Pain in right ankle and joints of right foot: Secondary | ICD-10-CM

## 2016-07-23 NOTE — Progress Notes (Signed)
Subjective:     Patient ID: Kathy [name], female   DOB: Aug 30, 1981, 35 y.o.   MRN: [id number]  HPI patient states I'm still having quite a right ankle with the swelling seems to have gone down a little bit but it's painful and I do have a flattening of my arch with inflammation noted   Review of Systems     Objective:   Physical Exam Neurovascular status intact muscle strength adequate with patient found to have inflammation around the posterior tibial tendon right with fluid buildup around the medial side and depression of the arch with chronic nature to condition and exquisite tenderness when I palpated the posterior tibial as it comes under the medial malleolus with no current indication that the tendon is torn    Assessment:     Probable acute posterior tibial tendinitis with inflammation secondary to foot structure    Plan:     Reviewed condition at great length and I do think that long-term this is going to require cast immobilization with orthotics lifting of the arch is with additional degrees be accomplished. I placed patient in an air fracture walker at the current time advised on orthotic usage and patient will be seen back to recheck when ready after scanning was accomplished today

## 2016-08-11 ENCOUNTER — Encounter: Payer: Self-pay | Admitting: Podiatry

## 2016-08-11 ENCOUNTER — Ambulatory Visit (INDEPENDENT_AMBULATORY_CARE_PROVIDER_SITE_OTHER): Payer: BLUE CROSS/BLUE SHIELD | Admitting: Podiatry

## 2016-08-11 DIAGNOSIS — M779 Enthesopathy, unspecified: Secondary | ICD-10-CM

## 2016-08-11 DIAGNOSIS — M21619 Bunion of unspecified foot: Secondary | ICD-10-CM

## 2016-08-11 DIAGNOSIS — M25571 Pain in right ankle and joints of right foot: Secondary | ICD-10-CM | POA: Diagnosis not present

## 2016-08-11 NOTE — Patient Instructions (Signed)

## 2016-08-11 NOTE — Progress Notes (Signed)
Subjective:     Patient ID: Kathy [name], female   DOB: Jun 09, 1981, 35 y.o.   MRN: [id number]  HPI patient presents stating her right foot is feeling somewhat better but it is still sore if she's on it a lot but she continues to wear the boot most of the time   Review of Systems     Objective:   Physical Exam Neurovascular status intact with tendinitis around the posterior tibial tendon right with inflammation and fluid and flatfoot deformity along with obesity    Assessment:     Stress of the posterior tibial tendon right which has improved but is still present    Plan:     Advised on anti-inflammatories physical therapy orthotic usage and gradual reduce of boot. Explained that we may need to get an MRI if symptoms were to persist or get worse and patient will be seen back to recheck in 4-6 weeks

## 2016-08-24 DIAGNOSIS — G4733 Obstructive sleep apnea (adult) (pediatric): Secondary | ICD-10-CM | POA: Diagnosis not present

## 2016-08-24 DIAGNOSIS — Z6841 Body Mass Index (BMI) 40.0 and over, adult: Secondary | ICD-10-CM | POA: Diagnosis not present

## 2016-08-24 DIAGNOSIS — I119 Hypertensive heart disease without heart failure: Secondary | ICD-10-CM | POA: Diagnosis not present

## 2016-10-28 ENCOUNTER — Institutional Professional Consult (permissible substitution): Payer: BLUE CROSS/BLUE SHIELD | Admitting: Pulmonary Disease

## 2016-11-24 DIAGNOSIS — N938 Other specified abnormal uterine and vaginal bleeding: Secondary | ICD-10-CM | POA: Diagnosis not present

## 2016-12-03 DIAGNOSIS — R938 Abnormal findings on diagnostic imaging of other specified body structures: Secondary | ICD-10-CM | POA: Diagnosis not present

## 2016-12-22 DIAGNOSIS — G4733 Obstructive sleep apnea (adult) (pediatric): Secondary | ICD-10-CM | POA: Diagnosis not present

## 2016-12-22 DIAGNOSIS — I119 Hypertensive heart disease without heart failure: Secondary | ICD-10-CM | POA: Diagnosis not present

## 2016-12-22 DIAGNOSIS — Z6841 Body Mass Index (BMI) 40.0 and over, adult: Secondary | ICD-10-CM | POA: Diagnosis not present

## 2016-12-24 ENCOUNTER — Institutional Professional Consult (permissible substitution): Payer: BLUE CROSS/BLUE SHIELD | Admitting: Pulmonary Disease

## 2017-01-03 DIAGNOSIS — I119 Hypertensive heart disease without heart failure: Secondary | ICD-10-CM | POA: Diagnosis not present

## 2017-01-03 DIAGNOSIS — I10 Essential (primary) hypertension: Secondary | ICD-10-CM | POA: Diagnosis not present

## 2017-01-13 DIAGNOSIS — G4733 Obstructive sleep apnea (adult) (pediatric): Secondary | ICD-10-CM | POA: Diagnosis not present

## 2017-01-13 DIAGNOSIS — I10 Essential (primary) hypertension: Secondary | ICD-10-CM | POA: Diagnosis not present

## 2017-01-13 DIAGNOSIS — I119 Hypertensive heart disease without heart failure: Secondary | ICD-10-CM | POA: Diagnosis not present

## 2017-02-09 ENCOUNTER — Institutional Professional Consult (permissible substitution): Payer: BLUE CROSS/BLUE SHIELD | Admitting: Pulmonary Disease

## 2017-03-17 DIAGNOSIS — I119 Hypertensive heart disease without heart failure: Secondary | ICD-10-CM | POA: Diagnosis not present

## 2017-03-17 DIAGNOSIS — I10 Essential (primary) hypertension: Secondary | ICD-10-CM | POA: Diagnosis not present

## 2017-03-17 DIAGNOSIS — G4733 Obstructive sleep apnea (adult) (pediatric): Secondary | ICD-10-CM | POA: Diagnosis not present

## 2017-04-01 ENCOUNTER — Telehealth: Payer: Self-pay | Admitting: Medical

## 2017-04-01 NOTE — Telephone Encounter (Signed)
Please call patient, received message from patient that she was sent for sleep study and that her insurance company has denied it, stating they need letter of medical necessity

## 2017-04-03 NOTE — Telephone Encounter (Signed)
Have her come in for recheck.  When we last discussed in 06/2016, there was an issue of cost for CPAP I believe.  Not sure if she has new insurance now or what the issue may be.

## 2017-04-04 NOTE — Telephone Encounter (Signed)
Made pt an appt.  

## 2017-04-12 ENCOUNTER — Institutional Professional Consult (permissible substitution): Payer: Self-pay | Admitting: Medical

## 2017-05-06 ENCOUNTER — Institutional Professional Consult (permissible substitution): Payer: BLUE CROSS/BLUE SHIELD | Admitting: Pulmonary Disease

## 2017-08-05 ENCOUNTER — Institutional Professional Consult (permissible substitution): Payer: BLUE CROSS/BLUE SHIELD | Admitting: Pulmonary Disease

## 2018-05-13 ENCOUNTER — Encounter (HOSPITAL_COMMUNITY): Payer: Self-pay | Admitting: Emergency Medicine

## 2018-05-13 ENCOUNTER — Other Ambulatory Visit: Payer: Self-pay

## 2018-05-13 ENCOUNTER — Emergency Department (HOSPITAL_COMMUNITY)
Admission: EM | Admit: 2018-05-13 | Discharge: 2018-05-14 | Disposition: A | Discharge status: Home / Self Care | Payer: BLUE CROSS/BLUE SHIELD | Attending: Emergency Medicine | Admitting: Emergency Medicine

## 2018-05-13 ENCOUNTER — Emergency Department (HOSPITAL_COMMUNITY): Payer: BLUE CROSS/BLUE SHIELD

## 2018-05-13 DIAGNOSIS — S6992XA Unspecified injury of left wrist, hand and finger(s), initial encounter: Secondary | ICD-10-CM | POA: Diagnosis not present

## 2018-05-13 DIAGNOSIS — M25512 Pain in left shoulder: Secondary | ICD-10-CM | POA: Diagnosis not present

## 2018-05-13 DIAGNOSIS — M25522 Pain in left elbow: Secondary | ICD-10-CM | POA: Diagnosis not present

## 2018-05-13 DIAGNOSIS — M791 Myalgia, unspecified site: Secondary | ICD-10-CM | POA: Insufficient documentation

## 2018-05-13 DIAGNOSIS — I10 Essential (primary) hypertension: Secondary | ICD-10-CM | POA: Insufficient documentation

## 2018-05-13 DIAGNOSIS — Z041 Encounter for examination and observation following transport accident: Secondary | ICD-10-CM | POA: Insufficient documentation

## 2018-05-13 DIAGNOSIS — M25532 Pain in left wrist: Secondary | ICD-10-CM | POA: Insufficient documentation

## 2018-05-13 DIAGNOSIS — M7918 Myalgia, other site: Secondary | ICD-10-CM

## 2018-05-13 DIAGNOSIS — S59902A Unspecified injury of left elbow, initial encounter: Secondary | ICD-10-CM | POA: Diagnosis not present

## 2018-05-13 DIAGNOSIS — Z79899 Other long term (current) drug therapy: Secondary | ICD-10-CM | POA: Insufficient documentation

## 2018-05-13 DIAGNOSIS — S4992XA Unspecified injury of left shoulder and upper arm, initial encounter: Secondary | ICD-10-CM | POA: Diagnosis not present

## 2018-05-13 MED ORDER — IBUPROFEN 600 MG PO TABS: 600.0000 mg | ORAL_TABLET | Freq: Four times a day (QID) | ORAL | 0 refills | Status: DC | PRN

## 2018-05-13 MED ORDER — CYCLOBENZAPRINE HCL 10 MG PO TABS: 10.0000 mg | ORAL_TABLET | Freq: Two times a day (BID) | ORAL | 0 refills | Status: DC | PRN
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
## 2018-05-14 NOTE — ED Notes (Signed)
PA discussed increased BP with pt, discussed follow-up care and initating primary care, pt expressed no further questions, was comfortable with d/c BP

## 2018-11-30 ENCOUNTER — Telehealth: Payer: Self-pay | Admitting: Medical

## 2018-11-30 NOTE — Telephone Encounter (Signed)
Dismissal letter in guarantor snapshot  °

## 2019-05-02 DIAGNOSIS — Z1151 Encounter for screening for human papillomavirus (HPV): Secondary | ICD-10-CM | POA: Diagnosis not present

## 2019-05-02 DIAGNOSIS — Z1329 Encounter for screening for other suspected endocrine disorder: Secondary | ICD-10-CM | POA: Diagnosis not present

## 2019-05-02 DIAGNOSIS — Z124 Encounter for screening for malignant neoplasm of cervix: Secondary | ICD-10-CM | POA: Diagnosis not present

## 2019-05-02 DIAGNOSIS — N938 Other specified abnormal uterine and vaginal bleeding: Secondary | ICD-10-CM | POA: Diagnosis not present

## 2019-05-02 DIAGNOSIS — Z833 Family history of diabetes mellitus: Secondary | ICD-10-CM | POA: Diagnosis not present

## 2019-05-02 DIAGNOSIS — Z8679 Personal history of other diseases of the circulatory system: Secondary | ICD-10-CM | POA: Diagnosis not present

## 2019-05-02 DIAGNOSIS — Z0000 Encounter for general adult medical examination without abnormal findings: Secondary | ICD-10-CM | POA: Diagnosis not present

## 2019-05-02 DIAGNOSIS — Z01419 Encounter for gynecological examination (general) (routine) without abnormal findings: Secondary | ICD-10-CM | POA: Diagnosis not present

## 2019-05-10 DIAGNOSIS — D649 Anemia, unspecified: Secondary | ICD-10-CM | POA: Diagnosis not present

## 2019-05-10 DIAGNOSIS — N938 Other specified abnormal uterine and vaginal bleeding: Secondary | ICD-10-CM | POA: Diagnosis not present
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
## 2019-06-18 DIAGNOSIS — R899 Unspecified abnormal finding in specimens from other organs, systems and tissues: Secondary | ICD-10-CM | POA: Diagnosis not present

## 2019-06-18 DIAGNOSIS — D649 Anemia, unspecified: Secondary | ICD-10-CM | POA: Diagnosis not present

## 2019-06-22 DIAGNOSIS — Z3042 Encounter for surveillance of injectable contraceptive: Secondary | ICD-10-CM | POA: Diagnosis not present

## 2019-07-24 ENCOUNTER — Encounter (HOSPITAL_COMMUNITY): Payer: Self-pay

## 2019-07-24 ENCOUNTER — Emergency Department (HOSPITAL_COMMUNITY)
Admission: EM | Admit: 2019-07-24 | Discharge: 2019-07-24 | Discharge status: Against Medical Advice | Payer: BC Managed Care – PPO | Attending: Emergency Medicine | Admitting: Emergency Medicine

## 2019-07-24 DIAGNOSIS — Z5321 Procedure and treatment not carried out due to patient leaving prior to being seen by health care provider: Secondary | ICD-10-CM | POA: Diagnosis not present

## 2019-07-24 DIAGNOSIS — N938 Other specified abnormal uterine and vaginal bleeding: Secondary | ICD-10-CM | POA: Diagnosis not present

## 2019-07-24 DIAGNOSIS — I10 Essential (primary) hypertension: Secondary | ICD-10-CM | POA: Diagnosis not present

## 2019-07-24 NOTE — ED Triage Notes (Signed)
Pt states that she was told by her dr that her BP was in the 300T systolic. Pt states that she used to take BP meds, but hasn't in "years".

## 2019-07-24 NOTE — ED Notes (Signed)
PT REQUESTING TO LEAVE. PT IS  IN NO ACUTE DISTRESS. PT HAS CONCERNS ABOUT COVID. WITNESSED PT COMING FROM OUTSIDE THE EMERGENCY ROOM INTO THE WAITING AREA.PT AWARE OF STATUS OF ROOMS AVAILABLE.  PT GIVEN RISK AND BENEFITS BEFORE LEAVING. PT LEAVES WITHOUT EVENT.

## 2019-08-09 ENCOUNTER — Other Ambulatory Visit: Payer: Self-pay

## 2019-08-09 DIAGNOSIS — Z20822 Contact with and (suspected) exposure to covid-19: Secondary | ICD-10-CM

## 2019-08-11 LAB — NOVEL CORONAVIRUS, NAA: SARS-CoV-2, NAA: NOT DETECTED

## 2020-11-11 ENCOUNTER — Ambulatory Visit: Payer: BC Managed Care – PPO | Admitting: Family

## 2020-12-09 ENCOUNTER — Other Ambulatory Visit: Payer: Self-pay

## 2020-12-09 ENCOUNTER — Ambulatory Visit (INDEPENDENT_AMBULATORY_CARE_PROVIDER_SITE_OTHER): Payer: BC Managed Care – PPO | Admitting: Family

## 2020-12-09 ENCOUNTER — Encounter (HOSPITAL_COMMUNITY): Payer: Self-pay | Admitting: Emergency Medicine

## 2020-12-09 ENCOUNTER — Emergency Department (HOSPITAL_COMMUNITY): Payer: BC Managed Care – PPO

## 2020-12-09 ENCOUNTER — Emergency Department (HOSPITAL_COMMUNITY)
Admission: EM | Admit: 2020-12-09 | Discharge: 2020-12-10 | Disposition: A | Discharge status: Home / Self Care | Payer: BC Managed Care – PPO | Attending: Emergency Medicine | Admitting: Emergency Medicine

## 2020-12-09 ENCOUNTER — Encounter: Payer: Self-pay | Admitting: Family

## 2020-12-09 VITALS — BP 200/142 | HR 89 | Ht 64.33 in | Wt 318.6 lb

## 2020-12-09 DIAGNOSIS — I499 Cardiac arrhythmia, unspecified: Secondary | ICD-10-CM | POA: Diagnosis not present

## 2020-12-09 DIAGNOSIS — Z5321 Procedure and treatment not carried out due to patient leaving prior to being seen by health care provider: Secondary | ICD-10-CM | POA: Insufficient documentation

## 2020-12-09 DIAGNOSIS — Z3202 Encounter for pregnancy test, result negative: Secondary | ICD-10-CM | POA: Diagnosis not present

## 2020-12-09 DIAGNOSIS — I10 Essential (primary) hypertension: Secondary | ICD-10-CM

## 2020-12-09 DIAGNOSIS — I160 Hypertensive urgency: Secondary | ICD-10-CM | POA: Diagnosis not present

## 2020-12-09 DIAGNOSIS — R5381 Other malaise: Secondary | ICD-10-CM | POA: Diagnosis not present

## 2020-12-09 DIAGNOSIS — Z7689 Persons encountering health services in other specified circumstances: Secondary | ICD-10-CM | POA: Diagnosis not present

## 2020-12-09 DIAGNOSIS — Z6841 Body Mass Index (BMI) 40.0 and over, adult: Secondary | ICD-10-CM

## 2020-12-09 MED ORDER — CLONIDINE HCL 0.2 MG PO TABS
0.2000 mg | ORAL_TABLET | Freq: Once | ORAL | Status: AC
  Administered 2020-12-09: 0.2 mg via ORAL

## 2020-12-09 NOTE — ED Triage Notes (Signed)
Per EMS-states she has a history of HTN-has not been on meds for 4 years, states "they were'nt working"-states she has no symptoms

## 2020-12-09 NOTE — Progress Notes (Signed)
Subjective:    Kathy [name] - 40 y.o. female MRN [id number]  Date of birth: 09-07-1981  HPI  Kathy [name] is to establish care. Patient has a PMH significant for hypertension, varicose veins, obstructive sleep apnea, pes planus of both feet, and ankle pain.    Current issues and/or concerns: 1. HYPERTENSION: Last visit with for hypertension management on record is 06/02/2016 per MD note: At that time discussed her concerns, compliance, medication costs. We made changes today for better costs and compliance. Discussed goal BP, possible complications. C/t efforts with healthy diet and exercise, weight loss efforts Recommendations:  STOP Chlorthalidone  STOP Bystolic  STOP Clonidine path   Continue Spironolactone daily  BEGIN Atenolol/Chlorthalidone combo pill once daily  Continue Nifedipine XR 60mg  daily  BEGIN Clonidine 0.1mg  tablet twice daily   12/09/2020: Diagnosed with hypertension at 40 years old. Has been on many medications since then. Last visit with primary care in 2017 followed by Cardiology in 2018. She cannot recall the medication regimen she was taking from Cardiology and that she was never medically released from their care. However, she does remember there were 5 pills and one of the pills she believes was Bystolic. Also, states that the medications did not work because her blood pressure remained high. Has a primarily vegetarian diet since 2018. Reports that all of the blood pressure medications she has taken in the past caused side effects. 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 States that she cannot take Lisinopril because it causes her feet to swell and that she retains water when taking the medication. States while taking Lisinopril she had to wrap her legs in ace bandages to assist with swelling. She found that over-the-counter Tumeric decreased swelling. Also,  states financial concerns in the past for purchasing medications, does currently have health insurance. Declines being referred to Cardiology and blood pressure clinic at this time.  Adherence with salt restriction (low-salt diet): []  Yes    [x]  No, and trying   Exercise: Yes []  No [x]  Home Monitoring?: []  Yes    [x]  No Smoking []  Yes [x]  No SOB? []  Yes    [x]  No Chest Pain?: []  Yes    [x]  No Leg swelling?: []  Yes    [x]  No Headaches?: []  Yes    [x]  No Dizziness? []  Yes    [x]  No  ROS per HPI    Health Maintenance:  Health Maintenance Due  Topic Date Due  . Hepatitis C Screening  Never done  . PAP SMEAR-Modifier  06/09/2018     Past Medical History: Patient Active Problem List   Diagnosis Date Noted  . Acquired foot deformity 06/28/2016  . Pes planus of both feet 06/28/2016  . Bunion of great toe, unspecified laterality 06/28/2016  . Ankle pain 06/28/2016  . Varicose veins 06/28/2016  . Sleep hypopnea 05/10/2016  . OSA (obstructive sleep apnea) 05/10/2016  . Accelerated hypertension 02/18/2016  . Obesity 02/18/2016  . Noncompliance 02/18/2016  . Nonspecific abnormal electrocardiogram (ECG) (EKG) 02/18/2016  . Witnessed apneic spells 01/05/2016  . Snoring 01/05/2016  . Morbid obesity (HCC) 01/05/2016  . Hypertensive urgency 01/05/2016  . Hypertension, uncontrolled 01/05/2016    Social History   reports that she has never smoked. She has never used smokeless tobacco. She reports that she does not drink alcohol and does not use drugs.   Family History  family history  includes Asthma in her sister; Cancer in her maternal aunt; Diabetes in her maternal grandfather, maternal grandmother, and mother; Heart disease in her maternal grandmother; Hypertension in her father, maternal grandmother, and mother; Other in her father.   Medications: reviewed and updated   Objective:   Physical Exam BP (!) 200/142 (BP Location: Right Arm, Patient Position: Sitting)   Pulse 89   Ht  5' 4.33" (1.634 m)   Wt (!) 318 lb 9.6 oz (144.5 kg)   SpO2 97%   BMI 54.13 kg/m    Vitals with BMI 12/09/2020 12/09/2020 12/09/2020  Height - - -  Weight - - -  BMI - - -  Systolic 208 200 678  Diastolic 145 142 938  Pulse 83 - -   Wt Readings from Last 3 Encounters:  12/09/20 (!) 318 lb 9.6 oz (144.5 kg)  05/13/18 300 lb (136.1 kg)  07/07/16 300 lb (136.1 kg)    Physical Exam Constitutional:      Appearance: She is obese.  HENT:     Head: Normocephalic.  Eyes:     Extraocular Movements: Extraocular movements intact.     Pupils: Pupils are equal, round, and reactive to light.  Cardiovascular:     Rate and Rhythm: Normal rate and regular rhythm.     Pulses: Normal pulses.     Heart sounds: Normal heart sounds.  Pulmonary:     Effort: Pulmonary effort is normal.     Breath sounds: Normal breath sounds.  Musculoskeletal:     Cervical back: Normal range of motion and neck supple.  Neurological:     General: No focal deficit present.     Mental Status: She is alert and oriented to person, place, and time.  Psychiatric:        Mood and Affect: Mood normal.        Behavior: Behavior normal.      Assessment & Plan:  1. Encounter to establish care: - Patient presents today to establish care.  - Return for annual physical examination, labs, and health maintenance. Arrive fasting meaning having had no food and/or nothing to drink for at least 8 hours prior to appointment.  2. Hypertensive urgency: - Blood pressure not at goal during today's visit. Patient asymptomatic without chest pressure, chest pain, palpitations, shortness of breath, and worst headache of life. - Administered Clonidine in clinic. Repeat blood pressures remained elevated 200s/140s-150s. - EMS called to clinic to transport patient to emergency department. EMTs Ander Gaster and Derrius [place]. Stephen present for transport.  - Report called to [facility] emergency department charge nurse Misty Stanley. - cloNIDine  (CATAPRES) tablet 0.2 mg  3. Hypertension, uncontrolled: - Chronic uncontrolled hypertension.   - Diagnosed with hypertension at 40 years old. Has been on many medications since then. Last visit with primary care in 2017 followed by Cardiology in 2018. She cannot recall the medication regimen she was taking from Cardiology and that she was never medically released from their care. However, she does remember there were 5 pills and one of the pills she believes was Bystolic. Also, states that the medications did not work because her blood pressure remained high. Has a primarily vegetarian diet since 2018. Reports that all of the blood pressure medications she has taken in the past caused side effects. 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 States that she cannot take Lisinopril because it causes her  feet to swell and that she retains water when taking the medication. States while taking Lisinopril she had to wrap her legs in ace bandages to assist with swelling. She found that over-the-counter Tumeric decreased swelling. Also, states financial concerns in the past for purchasing medications, does currently have health insurance. Declines being referred to Cardiology and blood pressure clinic at this time.  - Atenolol-Chlorthalidone and Losartan as prescribed.  - Follow-up in 1 week with clinical pharmacist for blood pressure check. Write down your blood pressure readings each day and bring those results along with your home blood pressure monitor to your appointment. Medications may be adjusted at that time if needed. - Counseled on blood pressure goal of less than 130/80, low-sodium, DASH diet, medication compliance, 150 minutes of moderate intensity exercise per week as tolerated. Discussed medication compliance, adverse effects. - atenolol-chlorthalidone (TENORETIC) 50-25 MG tablet; Take 1 tablet by mouth  daily.  Dispense: 30 tablet; Refill: 0  4. Class 3 severe obesity with serious comorbidity and body mass index (BMI) of 50.0 to 59.9 in adult, unspecified obesity type (HCC): - Counseled on low-sodium, DASH diet, and 150 minutes of moderate intensity exercise per week as tolerated.    Ricky Stabs, NP 12/09/2020, 5:52 PM Primary Care at [facility]

## 2020-12-09 NOTE — Progress Notes (Signed)
Establish care  BP issues  06/2016 Dr. Aleen Campi   STOP Chlorthalidone  STOP Bystolic  STOP Clonidine path   Continue Spironolactone daily  BEGIN Atenolol/Chlorthalidone combo pill once daily  Continue Nifedipine XR 60mg  daily  BEGIN Clonidine 0.1mg  tablet twice daily went to Dr. for HTN management cant remember when

## 2020-12-09 NOTE — Patient Instructions (Addendum)
Return for annual physical examination, labs, and health maintenance. Arrive fasting meaning having had no food and/or nothing to drink for at least 8 hours prior to appointment.  Spironolactone and Atenolol-Chlorthalidone for high blood pressure.  Follow-up in 1 week for blood pressure check at [facility] [address] [place], [address] Phone: [phone] Thank you for choosing Primary Care at [facility] for your medical home!    Kathy [name] was seen by Rema Fendt, NP today.   Kathy [name]'s primary care provider is Adeline Petitfrere Jodi Geralds, NP.   For the best care possible,  you should try to see Ricky Stabs, NP whenever you come to clinic.   We look forward to seeing you again soon!  If you have any questions about your visit today,  please call us at [phone]  Or feel free to reach your provider via MyChart.   Hypertension, Adult Hypertension is another name for high blood pressure. High blood pressure forces your heart to work harder to pump blood. This can cause problems over time. There are two numbers in a blood pressure reading. There is a top number (systolic) over a bottom number (diastolic). It is best to have a blood pressure that is below 120/80. Healthy choices can help lower your blood pressure, or you may need medicine to help lower it. What are the causes? The cause of this condition is not known. Some conditions may be related to high blood pressure. What increases the risk?  Smoking.  Having type 2 diabetes mellitus, high cholesterol, or both.  Not getting enough exercise or physical activity.  Being overweight.  Having too much fat, sugar, calories, or salt (sodium) in your diet.  Drinking too much alcohol.  Having long-term (chronic) kidney disease.  Having a family history of high blood pressure.  Age. Risk increases with age.  Race. You may be at higher risk if you are African  American.  Gender. Men are at higher risk than women before age 2. After age 27, women are at higher risk than men.  Having obstructive sleep apnea.  Stress. What are the signs or symptoms?  High blood pressure may not cause symptoms. Very high blood pressure (hypertensive crisis) may cause: ? Headache. ? Feelings of worry or nervousness (anxiety). ? Shortness of breath. ? Nosebleed. ? A feeling of being sick to your stomach (nausea). ? Throwing up (vomiting). ? Changes in how you see. ? Very bad chest pain. ? Seizures. How is this treated?  This condition is treated by making healthy lifestyle changes, such as: ? Eating healthy foods. ? Exercising more. ? Drinking less alcohol.  Your health care provider may prescribe medicine if lifestyle changes are not enough to get your blood pressure under control, and if: ? Your top number is above 130. ? Your bottom number is above 80.  Your personal target blood pressure may vary. Follow these instructions at home: Eating and drinking  If told, follow the DASH eating plan. To follow this plan: ? Fill one half of your plate at each meal with fruits and vegetables. ? Fill one fourth of your plate at each meal with whole grains. Whole grains include whole-wheat pasta, brown rice, and whole-grain bread. ? Eat or drink low-fat dairy products, such as skim milk or low-fat yogurt. ? Fill one fourth of your plate at each meal with low-fat (lean) proteins. Low-fat proteins include fish, chicken without skin, eggs,  beans, and tofu. ? Avoid fatty meat, cured and processed meat, or chicken with skin. ? Avoid pre-made or processed food.  Eat less than 1,500 mg of salt each day.  Do not drink alcohol if: ? Your doctor tells you not to drink. ? You are pregnant, may be pregnant, or are planning to become pregnant.  If you drink alcohol: ? Limit how much you use to:  0-1 drink a day for women.  0-2 drinks a day for men. ? Be aware of  how much alcohol is in your drink. In the [place] one drink equals one 12 oz bottle of beer (355 mL), one 5 oz glass of wine (148 mL), or one 1 oz glass of hard liquor (44 mL).   Lifestyle  Work with your doctor to stay at a healthy weight or to lose weight. Ask your doctor what the best weight is for you.  Get at least 30 minutes of exercise most days of the week. This may include walking, swimming, or biking.  Get at least 30 minutes of exercise that strengthens your muscles (resistance exercise) at least 3 days a week. This may include lifting weights or doing Pilates.  Do not use any products that contain nicotine or tobacco, such as cigarettes, e-cigarettes, and chewing tobacco. If you need help quitting, ask your doctor.  Check your blood pressure at home as told by your doctor.  Keep all follow-up visits as told by your doctor. This is important.   Medicines  Take over-the-counter and prescription medicines only as told by your doctor. Follow directions carefully.  Do not skip doses of blood pressure medicine. The medicine does not work as well if you skip doses. Skipping doses also puts you at risk for problems.  Ask your doctor about side effects or reactions to medicines that you should watch for. Contact a doctor if you:  Think you are having a reaction to the medicine you are taking.  Have headaches that keep coming back (recurring).  Feel dizzy.  Have swelling in your ankles.  Have trouble with your vision. Get help right away if you:  Get a very bad headache.  Start to feel mixed up (confused).  Feel weak or numb.  Feel faint.  Have very bad pain in your: ? Chest. ? Belly (abdomen).  Throw up more than once.  Have trouble breathing. Summary  Hypertension is another name for high blood pressure.  High blood pressure forces your heart to work harder to pump blood.  For most people, a normal blood pressure is less than 120/80.  Making healthy choices  can help lower blood pressure. If your blood pressure does not get lower with healthy choices, you may need to take medicine. This information is not intended to replace advice given to you by your health care provider. Make sure you discuss any questions you have with your health care provider. Document Revised: 06/28/2018 Document Reviewed: 06/28/2018 Elsevier Patient Education  2021 [employer].

## 2020-12-10 ENCOUNTER — Telehealth: Payer: Self-pay | Admitting: General Practice

## 2020-12-10 MED ORDER — ATENOLOL-CHLORTHALIDONE 50-25 MG PO TABS: 1.0000 | ORAL_TABLET | Freq: Every day | ORAL | 0 refills | Status: DC

## 2020-12-10 MED ORDER — LOSARTAN POTASSIUM 50 MG PO TABS: 50.0000 mg | ORAL_TABLET | Freq: Every day | ORAL | 0 refills | Status: DC

## 2020-12-10 NOTE — Telephone Encounter (Signed)
Rtnd call to pt.No ans lvm to call ofc

## 2020-12-10 NOTE — Telephone Encounter (Signed)
Pt is calling asking to speak to PCP and Following up on yesterday's visit and wanted to review the medication discussed so it could be prescribed and sent to [facility] [address] [place], [place] [address]. Please advise and thank you

## 2020-12-10 NOTE — ED Notes (Signed)
Pt not noted to be in lobby upon arrival to ER this morning. Will be taken out of the computer. Was pulled off the floor around shift change last night.

## 2021-01-04 ENCOUNTER — Other Ambulatory Visit: Payer: Self-pay | Admitting: Family

## 2021-01-04 DIAGNOSIS — I10 Essential (primary) hypertension: Secondary | ICD-10-CM

## 2021-01-05 NOTE — Telephone Encounter (Signed)
Medications refilled per patient request. Patient need an appointment with clinical pharmacist at [facility] for blood pressure check within 1 week, please schedule for patient.

## 2021-01-05 NOTE — Telephone Encounter (Signed)
Pt needs BP meds refilled

## 2021-02-13 ENCOUNTER — Other Ambulatory Visit: Payer: Self-pay | Admitting: Family

## 2021-02-13 DIAGNOSIS — I10 Essential (primary) hypertension: Secondary | ICD-10-CM

## 2021-02-16 NOTE — Telephone Encounter (Signed)
Scheduled appointment for patient to f/u for BP with Franky Macho, CPP at [facility]. Patient states she has been out of medication taking last dose yesterday.  Patient aware of appointment.

## 2021-02-23 ENCOUNTER — Ambulatory Visit: Payer: BC Managed Care – PPO | Attending: Family Medicine | Admitting: Pharmacist

## 2021-02-23 ENCOUNTER — Encounter: Payer: Self-pay | Admitting: Pharmacist

## 2021-02-23 ENCOUNTER — Other Ambulatory Visit: Payer: Self-pay

## 2021-02-23 VITALS — BP 184/121

## 2021-02-23 DIAGNOSIS — I10 Essential (primary) hypertension: Secondary | ICD-10-CM | POA: Diagnosis not present

## 2021-02-23 MED ORDER — CARVEDILOL 12.5 MG PO TABS: 12.5000 mg | ORAL_TABLET | Freq: Two times a day (BID) | ORAL | 2 refills | Status: DC

## 2021-02-23 MED ORDER — VALSARTAN-HYDROCHLOROTHIAZIDE 320-25 MG PO TABS: 1.0000 | ORAL_TABLET | Freq: Every day | ORAL | 3 refills | Status: DC

## 2021-02-23 NOTE — Progress Notes (Signed)
   S:   PCP: Durene Fruits    Patient arrives in good spirits. Presents to the clinic for hypertension evaluation, counseling, and management. Patient was referred and last seen by Primary Care Provider on 12/09/2020.   Medication adherence reported.  Current BP Medications include:  Atenolol-chlorthalidone 50-25 mg daily, losartan 50 mg daily   Antihypertensives tried in the past include: Atenolol, Amlodipine, HCTZ, Losartan HCT, Diltiazem, Lisinopril  Dietary habits include: compliant with salt restriction; admits to heavy caffeine use  Exercise habits include: none  Family / Social history:  -FHx: HTN, DM, heart disease  -Alcohol: none -Tobacco: never smoker   O:  Vitals:   02/23/21 1612  BP: (!) 184/121   Home BP readings: none  Last 3 Office BP readings: BP Readings from Last 3 Encounters:  02/23/21 (!) 184/121  12/09/20 (!) 208/145  12/09/20 (!) 200/142    BMET    Component Value Date/Time   NA 137 01/05/2016 0001   K 3.6 01/05/2016 0001   CL 104 01/05/2016 0001   CO2 26 01/05/2016 0001   GLUCOSE 95 01/05/2016 0001   BUN 11 01/05/2016 0001   CREATININE 0.74 01/05/2016 0001   CALCIUM 7.9 (L) 01/05/2016 0001   GFRNONAA >60 08/12/2008 0400   GFRAA  08/12/2008 0400    >60        The eGFR has been calculated using the MDRD equation. This calculation has not been validated in all clinical    Renal function: CrCl cannot be calculated (Patient's most recent lab result is older than the maximum 21 days allowed.).  Clinical ASCVD: No  The ASCVD Risk score Mikey Bussing DC Jr., et al., 2013) failed to calculate for the following reasons:   The 2013 ASCVD risk score is only valid for ages 25 to 49   A/P: Hypertension longstanding currently uncontrolled on current medications. BP Goal = < 130/80 mmHg. Medication adherence reported. Will stop her current regimen. Change to Diovan-HCT. Add carvedilol to take place of the atenolol.  -Started Diovan-HCT 320-25 mg  daily. -Started carvedilol 12.5 mg BID  -Counseled on lifestyle modifications for blood pressure control including reduced dietary sodium, increased exercise, adequate sleep.  Results reviewed and written information provided.   Total time in face-to-face counseling 15 minutes.   F/U Clinic Visit in 2 weeks.  Benard Halsted, PharmD, [facility], [place] [phone]

## 2021-03-09 ENCOUNTER — Ambulatory Visit: Payer: BC Managed Care – PPO | Attending: Family | Admitting: Pharmacist

## 2021-03-09 ENCOUNTER — Other Ambulatory Visit: Payer: Self-pay

## 2021-03-09 ENCOUNTER — Encounter: Payer: Self-pay | Admitting: Pharmacist

## 2021-03-09 VITALS — BP 175/126 | HR 81

## 2021-03-09 DIAGNOSIS — I10 Essential (primary) hypertension: Secondary | ICD-10-CM

## 2021-03-09 MED ORDER — VALSARTAN-HYDROCHLOROTHIAZIDE 320-25 MG PO TABS: 1.0000 | ORAL_TABLET | Freq: Every day | ORAL | 3 refills | Status: DC

## 2021-03-09 MED ORDER — CARVEDILOL 25 MG PO TABS
25.0000 mg | ORAL_TABLET | Freq: Two times a day (BID) | ORAL | 3 refills | Status: DC
Start: 2021-03-09 — End: 2021-07-08

## 2021-03-09 NOTE — Progress Notes (Signed)
   S:   PCP: Durene Fruits    Patient arrives in good spirits. Presents to the clinic for hypertension evaluation, counseling, and management. Patient was referred and last seen by Primary Care Provider on 12/09/2020. I saw her on 02/23/2021 and stopped her atenolol-chlorthalidone combo. Also stopped losartan. Started valsartan-HCTZ 320-25 mg daily along with carvedilol 12.5 mg BID.   Medication adherence reported. Patient denies chest pain, dyspnea, HA or blurred vision. Denies excessive fatigue or dizziness.   Current BP Medications include: valsartan 320-25 mg daily, carvedilol 25 mg BID  Antihypertensives tried in the past include: Atenolol, Amlodipine, HCTZ, Losartan HCT, Diltiazem, Lisinopril  Dietary habits include: compliant with salt restriction; admits to heavy caffeine use  Exercise habits include: none  Family / Social history:  -FHx: HTN, DM, heart disease  -Alcohol: none -Tobacco: never smoker   O:  Vitals:   03/09/21 1637  BP: (!) 175/126  Pulse: 81    Home BP readings: none  Last 3 Office BP readings: BP Readings from Last 3 Encounters:  03/09/21 (!) 175/126  02/23/21 (!) 184/121  12/09/20 (!) 208/145   BMET    Component Value Date/Time   NA 137 01/05/2016 0001   K 3.6 01/05/2016 0001   CL 104 01/05/2016 0001   CO2 26 01/05/2016 0001   GLUCOSE 95 01/05/2016 0001   BUN 11 01/05/2016 0001   CREATININE 0.74 01/05/2016 0001   CALCIUM 7.9 (L) 01/05/2016 0001   GFRNONAA >60 08/12/2008 0400   GFRAA  08/12/2008 0400    >60        The eGFR has been calculated using the MDRD equation. This calculation has not been validated in all clinical    Renal function: CrCl cannot be calculated (Patient's most recent lab result is older than the maximum 21 days allowed.).  Clinical ASCVD: No  The ASCVD Risk score Mikey Bussing DC Jr., et al., 2013) failed to calculate for the following reasons:   The 2013 ASCVD risk score is only valid for ages 46 to  74   A/P: Hypertension longstanding currently uncontrolled on current medications. BP Goal = < 130/80 mmHg. SBP down ~10 mmHg but continues to be very elevated. Medication adherence reported. Will continue Diovan-HCT combination and increase carvedilol to 25 mg BID.   -Continue Diovan-HCT 320-25 mg daily. -Increase carvedilol to 25 mg BID  -Counseled on lifestyle modifications for blood pressure control including reduced dietary sodium, increased exercise, adequate sleep. -CMP14 +eGFR  Results reviewed and written information provided.   Total time in face-to-face counseling 15 minutes.   F/U Clinic Visit in 4 weeks.  Benard Halsted, PharmD, [facility], [place] [phone]

## 2021-03-10 LAB — CMP14+EGFR
ALT: 14 IU/L (ref 0–32)
AST: 14 IU/L (ref 0–40)
Albumin/Globulin Ratio: 1.4 (ref 1.2–2.2)
Albumin: 4.2 g/dL (ref 3.8–4.8)
Alkaline Phosphatase: 48 IU/L (ref 44–121)
BUN/Creatinine Ratio: 21 (ref 9–23)
BUN: 16 mg/dL (ref 6–20)
Bilirubin Total: 0.3 mg/dL (ref 0.0–1.2)
CO2: 23 mmol/L (ref 20–29)
Calcium: 8.5 mg/dL — ABNORMAL LOW (ref 8.7–10.2)
Chloride: 100 mmol/L (ref 96–106)
Creatinine, Ser: 0.77 mg/dL (ref 0.57–1.00)
Globulin, Total: 3.1 g/dL (ref 1.5–4.5)
Glucose: 81 mg/dL (ref 65–99)
Potassium: 3.3 mmol/L — ABNORMAL LOW (ref 3.5–5.2)
Sodium: 138 mmol/L (ref 134–144)
Total Protein: 7.3 g/dL (ref 6.0–8.5)
eGFR: 101 mL/min/{1.73_m2} (ref >59)

## 2021-03-10 NOTE — Progress Notes (Signed)
Kidney function normal.   Liver function normal.   Potassium mildly low. Try to increase potassium rich foods like bananas, oranges, spinach, and tomatoes.  Calcium mildly low. Try to increase calcium rich foods like milk, yogurt, cheese, broccoli, kale, and collard greens.

## 2021-03-15 ENCOUNTER — Other Ambulatory Visit: Payer: Self-pay | Admitting: Family

## 2021-03-15 DIAGNOSIS — I10 Essential (primary) hypertension: Secondary | ICD-10-CM

## 2021-04-09 ENCOUNTER — Encounter: Payer: Self-pay | Admitting: Pharmacist

## 2021-04-09 ENCOUNTER — Other Ambulatory Visit: Payer: Self-pay

## 2021-04-09 ENCOUNTER — Ambulatory Visit: Payer: BC Managed Care – PPO | Attending: Family | Admitting: Pharmacist

## 2021-04-09 VITALS — BP 179/127 | HR 80

## 2021-04-09 DIAGNOSIS — I10 Essential (primary) hypertension: Secondary | ICD-10-CM | POA: Diagnosis not present

## 2021-04-09 MED ORDER — SPIRONOLACTONE 25 MG PO TABS
25.0000 mg | ORAL_TABLET | Freq: Every day | ORAL | 0 refills | Status: DC
Start: 2021-04-09 — End: 2021-07-08

## 2021-04-09 NOTE — Progress Notes (Signed)
   S:   PCP: Durene Fruits    Patient arrives in good spirits. Presents to the clinic for hypertension evaluation, counseling, and management. Patient was referred and last seen by Primary Care Provider on 12/09/2020. I saw her on 03/09/2021 and increased carvedilol to 25 mg BID.   Medication adherence reported. Patient denies chest pain, dyspnea, HA or blurred vision. Denies excessive fatigue or dizziness.   Current BP Medications include: valsartan 320-25 mg daily, carvedilol 25 mg BID  Antihypertensives tried in the past include: Atenolol, Amlodipine, HCTZ, Losartan HCT, Diltiazem, Lisinopril  Dietary habits include: compliant with salt restriction; admits to heavy caffeine use  Exercise habits include: none  Family / Social history:  -FHx: HTN, DM, heart disease  -Alcohol: none -Tobacco: never smoker   O:  Vitals:   04/09/21 1619  BP: (!) 179/127  Pulse: 80     Home BP readings: none  Last 3 Office BP readings: BP Readings from Last 3 Encounters:  04/09/21 (!) 179/127  03/09/21 (!) 175/126  02/23/21 (!) 184/121   BMET    Component Value Date/Time   NA 138 03/09/2021 1637   K 3.3 (L) 03/09/2021 1637   CL 100 03/09/2021 1637   CO2 23 03/09/2021 1637   GLUCOSE 81 03/09/2021 1637   GLUCOSE 95 01/05/2016 0001   BUN 16 03/09/2021 1637   CREATININE 0.77 03/09/2021 1637   CREATININE 0.74 01/05/2016 0001   CALCIUM 8.5 (L) 03/09/2021 1637   GFRNONAA >60 08/12/2008 0400   GFRAA  08/12/2008 0400    >60        The eGFR has been calculated using the MDRD equation. This calculation has not been validated in all clinical    Renal function: CrCl cannot be calculated (Patient's most recent lab result is older than the maximum 21 days allowed.).  Clinical ASCVD: No  The ASCVD Risk score Mikey Bussing DC Jr., et al., 2013) failed to calculate for the following reasons:   The 2013 ASCVD risk score is only valid for ages 39 to 67   A/P: Hypertension longstanding currently  uncontrolled on current medications. BP Goal = < 130/80 mmHg. Medication adherence reported. -Continue Diovan-HCT 320-25 mg daily. -Continue carvedilol to 25 mg BID.  -Add spironolactone 25 mg daily.  -Counseled on lifestyle modifications for blood pressure control including reduced dietary sodium, increased exercise, adequate sleep. -CMP14 +eGFR, future in 1 week  Results reviewed and written information provided.   Total time in face-to-face counseling 15 minutes.   F/U lab visit in 1 week.   Benard Halsted, PharmD, [facility], [place] [phone]

## 2021-04-11 DIAGNOSIS — Z20822 Contact with and (suspected) exposure to covid-19: Secondary | ICD-10-CM | POA: Diagnosis not present

## 2021-04-16 ENCOUNTER — Other Ambulatory Visit: Payer: Self-pay

## 2021-04-16 ENCOUNTER — Ambulatory Visit: Payer: BC Managed Care – PPO | Attending: Family Medicine

## 2021-04-16 DIAGNOSIS — I10 Essential (primary) hypertension: Secondary | ICD-10-CM | POA: Diagnosis not present

## 2021-04-17 LAB — BASIC METABOLIC PANEL
BUN/Creatinine Ratio: 13 (ref 9–23)
BUN: 12 mg/dL (ref 6–20)
CO2: 22 mmol/L (ref 20–29)
Calcium: 9 mg/dL (ref 8.7–10.2)
Chloride: 101 mmol/L (ref 96–106)
Creatinine, Ser: 0.91 mg/dL (ref 0.57–1.00)
Glucose: 99 mg/dL (ref 65–99)
Potassium: 4.2 mmol/L (ref 3.5–5.2)
Sodium: 137 mmol/L (ref 134–144)
eGFR: 83 mL/min/{1.73_m2} (ref >59)

## 2021-04-17 NOTE — Progress Notes (Signed)
Kidney function normal

## 2021-05-28 NOTE — Progress Notes (Signed)
Patient ID: Kathy [name], female    DOB: 1981-09-30  MRN: [id number]  CC: Hypertension Follow-Up   Subjective: Kathy [name] is a 41 y.o. female who presents for hypertension follow-up.   Her concerns today include:   HYPERTENSION FOLLOW-UP: 12/09/2020: - Chronic uncontrolled hypertension.   - Diagnosed with hypertension at 40 years old. Has been on many medications since then. Last visit with primary care in 2017 followed by Cardiology in 2018. She cannot recall the medication regimen she was taking from Cardiology and that she was never medically released from their care. However, she does remember there were 5 pills and one of the pills she believes was Bystolic. Also, states that the medications did not work because her blood pressure remained high. Has a primarily vegetarian diet since 2018. Reports that all of the blood pressure medications she has taken in the past caused side effects. For example states medications make her feel drowsy and that she is unable to take the medications while she is working (she is an Tourist information centre manager). States that she cannot take Lisinopril because it causes her feet to swell and that she retains water when taking the medication. States while taking Lisinopril she had to wrap her legs in ace bandages to assist with swelling. She found that over-the-counter Tumeric decreased swelling. Also, states financial concerns in the past for purchasing medications, does currently have health insurance. Declines being referred to Cardiology and blood pressure clinic at this time. - Atenolol-Chlorthalidone and Losartan as prescribed. - Follow-up in 1 week with clinical pharmacist for blood pressure check. Write down your blood pressure readings each day and bring those results along with your home blood pressure monitor to your appointment. Medications may be adjusted at that time if needed.  Visit 03/09/2021 at [place] per PharmD  note: Hypertension longstanding currently uncontrolled on current medications. BP Goal = < 130/80 mmHg. SBP down ~10 mmHg but continues to be very elevated. Medication adherence reported. Will continue Diovan-HCT combination and increase carvedilol to 25 mg BID.   -Continue Diovan-HCT 320-25 mg daily. -Increase carvedilol to 25 mg BID -Counseled on lifestyle modifications for blood pressure control including reduced dietary sodium, increased exercise, adequate sleep. -ULA45 +eGFR  05/29/2021: Reports she is taking Diovan-HCT and Carvedilol as prescribed. She is taking all medications at bedtime except Spironolactone which has a morning dose.  Med Adherence: _0  Yes    _1  No Medication side effects: _2  Yes    _3  No Adherence with salt restriction (low-salt diet): _4  Yes  _5  No Exercise: Yes _6  No _7  Recently purchased a gym membership and may begin soon Home Monitoring?: _8  Yes    _9  No Monitoring Frequency: _10  Yes    _11  No Home BP results range: _12  Yes    _13  No Chest Pain?: _14  Yes    _15  No Leg swelling?: _16  Yes    _17  No  2. BILATERAL KNEE PAIN: Recently prescribed Spironolactone and feels this may be causing or worsening bilateral knee pain. Also considered if low potassium levels may be causing knee pain as she has history of low potassium. She does not want to begin pain medication for the knees, prefers natural remedies. Also, having some swelling of the bilateral knees and legs area directly below knees. She does stand a lot at her job.  Patient Active Problem List   Diagnosis Date Noted   Acquired foot deformity 06/28/2016   Pes planus of both feet 06/28/2016  Bunion of great toe, unspecified laterality 06/28/2016   Ankle pain 06/28/2016   Varicose veins 06/28/2016   Sleep hypopnea 05/10/2016   OSA (obstructive sleep apnea) 05/10/2016   Accelerated hypertension 02/18/2016   Obesity 02/18/2016   Noncompliance 02/18/2016   Nonspecific abnormal electrocardiogram (ECG)  (EKG) 02/18/2016   Witnessed apneic spells 01/05/2016   Snoring 01/05/2016   Morbid obesity ([place]) 01/05/2016   Hypertensive urgency 01/05/2016   Hypertension, uncontrolled 01/05/2016     Current Outpatient Medications on File Prior to Visit  Medication Sig Dispense Refill   Ascorbic Acid (VITA-C PO) Take 1 tablet by mouth daily.      carvedilol (COREG) 25 MG tablet Take 1 tablet (25 mg total) by mouth 2 (two) times daily with a meal. 60 tablet 3   ferrous sulfate 325 (65 FE) MG tablet Take 325 mg by mouth daily with breakfast.     Flaxseed, Linseed, (FLAXSEED OIL PO) Take 1 capsule by mouth daily.     Multiple Vitamin (MULTIVITAMIN WITH MINERALS) TABS tablet Take 1 tablet by mouth daily.     spironolactone (ALDACTONE) 25 MG tablet Take 1 tablet (25 mg total) by mouth daily. 90 tablet 0   valsartan-hydrochlorothiazide (DIOVAN-HCT) 320-25 MG tablet Take 1 tablet by mouth daily. 90 tablet 3   No current facility-administered medications on file prior to visit.    Allergies  Allergen Reactions   Kiwi Extract Nausea And Vomiting   Shrimp [Shellfish Allergy] Swelling    Social History   Socioeconomic History   Marital status: Single    Spouse name: Not on file   Number of children: Not on file   Years of education: Not on file   Highest education level: Not on file  Occupational History   Not on file  Tobacco Use   Smoking status: Never   Smokeless tobacco: Never  Vaping Use   Vaping Use: Never used  Substance and Sexual Activity   Alcohol use: No   Drug use: No   Sexual activity: Never  Other Topics Concern   Not on file  Social History Narrative   Lives with mother.   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  Exercise - goes to [employer], treadmill and weights, goes 2-3 times per week.   Diet - consistent with 3 meals, but sometimes could be better.  No significant other. No children.    Social Determinants of Health    Financial Resource Strain: Not on file  Food Insecurity: Not on file  Transportation Needs: Not on file  Physical Activity: Not on file  Stress: Not on file  Social Connections: Not on file  Intimate Partner Violence: Not on file    Family History  Problem Relation Age of Onset   Hypertension Mother    Diabetes Mother    Other Father        died of unknown causes   Hypertension Father    Asthma Sister    Heart disease Maternal Grandmother    Diabetes Maternal Grandmother    Hypertension Maternal Grandmother    Cancer Maternal Aunt        cervical   Diabetes Maternal Grandfather     Past Surgical History:  Procedure Laterality Date   NO PAST SURGERIES  06/2015    ROS: Review of Systems Negative except as stated above  PHYSICAL EXAM: BP (!) 159/105 (BP Location: Left Arm, Patient Position: Sitting, Cuff Size: Large)   Pulse 83  Temp 97.8 F (36.6 C)   Resp 18   Ht 5' 4.57" (1.64 m)   Wt (!) 330 lb 6.4 oz (149.9 kg)   SpO2 96%   BMI 55.72 kg/m   Physical Exam HENT:     Head: Normocephalic and atraumatic.  Eyes:     Extraocular Movements: Extraocular movements intact.     Conjunctiva/sclera: Conjunctivae normal.     Pupils: Pupils are equal, round, and reactive to light.  Cardiovascular:     Rate and Rhythm: Normal rate and regular rhythm.     Pulses: Normal pulses.     Heart sounds: Normal heart sounds.  Pulmonary:     Effort: Pulmonary effort is normal.     Breath sounds: Normal breath sounds.  Musculoskeletal:     Cervical back: Normal range of motion and neck supple.  Neurological:     General: No focal deficit present.     Mental Status: She is alert and oriented to person, place, and time.  Psychiatric:        Mood and Affect: Mood normal.        Behavior: Behavior normal.   ASSESSMENT AND PLAN: 1. Accelerated hypertension: - Blood pressure not at goal during today's visit. Patient asymptomatic without chest pressure, chest pain,  palpitations, shortness of breath, and worst headache of life. - Patient with chronically uncontrolled hypertension. - During visit in February 2022 patient reported she is intolerant to several hypertension medications all of which she cannot recall. Does remember than one medication was Lisinopril. - During visit in February 2022  patient reported most blood pressure medications she tried in the past caused side effects. - Continue Carvedilol, Spironolactone, and Valsartan-Hydrochlorothiazide as prescribed. - Counseled on blood pressure goal of less than 130/80, low-sodium, DASH diet, medication compliance, 150 minutes of moderate intensity exercise per week as tolerated. Discussed medication compliance, adverse effects. - Potassium to screen for too low or too high levels.  - Referral to Cardiology for further evaluation and management.  - Follow-up with primary provider as scheduled.  - Ambulatory referral to Cardiology - Potassium  2. Peripheral edema: - Furosemide as prescribed.  - Follow-up with primary provider as scheduled.  - furosemide (LASIX) 20 MG tablet; Take 1 tablet (20 mg total) by mouth daily for 7 days.  Dispense: 7 tablet; Refill: 0   Patient was given the opportunity to ask questions.  Patient verbalized understanding of the plan and was able to repeat key elements of the plan. Patient was given clear instructions to go to Emergency Department or return to medical center if symptoms don't improve, worsen, or new problems develop.The patient verbalized understanding.   Orders Placed This Encounter  Procedures   Potassium   Ambulatory referral to Cardiology     Requested Prescriptions   Signed Prescriptions Disp Refills   furosemide (LASIX) 20 MG tablet 7 tablet 0    Sig: Take 1 tablet (20 mg total) by mouth daily for 7 days.    Follow-up with primary provider as scheduled.   Camillia Herter, NP

## 2021-05-29 ENCOUNTER — Other Ambulatory Visit: Payer: Self-pay

## 2021-05-29 ENCOUNTER — Encounter: Payer: Self-pay | Admitting: Family

## 2021-05-29 ENCOUNTER — Ambulatory Visit: Payer: BC Managed Care – PPO | Admitting: Family

## 2021-05-29 VITALS — BP 159/105 | HR 83 | Temp 97.8°F | Resp 18 | Ht 64.57 in | Wt 330.4 lb

## 2021-05-29 DIAGNOSIS — R609 Edema, unspecified: Secondary | ICD-10-CM | POA: Diagnosis not present

## 2021-05-29 DIAGNOSIS — I10 Essential (primary) hypertension: Secondary | ICD-10-CM | POA: Diagnosis not present

## 2021-05-29 MED ORDER — FUROSEMIDE 20 MG PO TABS: 20.0000 mg | ORAL_TABLET | Freq: Every day | ORAL | 0 refills | Status: DC

## 2021-05-29 NOTE — Progress Notes (Signed)
Pt presents for hypertension follow-up, pt stated that approx 1 month ago bilateral knee pain onset pt stated pain usually occurs at bedtime

## 2021-05-30 LAB — POTASSIUM: Potassium: 3.9 mmol/L (ref 3.5–5.2)

## 2021-05-30 NOTE — Progress Notes (Signed)
Potassium normal.   

## 2021-07-07 ENCOUNTER — Other Ambulatory Visit: Payer: Self-pay | Admitting: Family Medicine

## 2021-07-07 DIAGNOSIS — I10 Essential (primary) hypertension: Secondary | ICD-10-CM

## 2021-07-07 NOTE — Telephone Encounter (Signed)
Requested medications are due for refill today.  yes  Requested medications are on the active medications list.  Yes  Last refill. 04/09/2021 for spironolactone,  03/09/2021 for Carvedilol.  Future visit scheduled.   no  Notes to clinic.  Per chart no PCP.

## 2021-08-18 NOTE — Progress Notes (Signed)
Cardiology Office Note:    Date:  08/19/2021   ID:  Kathy [name], DOB 12-22-1980, MRN [id number]  PCP:  Patient, No Pcp Per (Inactive)   [facility] Providers Cardiologist:  None     Referring MD: Rema Fendt, NP  CC: HTN Eval Consulted for the evaluation of HTN at the behest of Patient, No Pcp Per (Inactive)  History of Present Illness:    Kathy [name] is a 41 y.o. female with a hx of juvenille HTN, Morbid Obesity, and OSA hwo presents for evaluation 08/19/21.  Patient notes that she is feeling great.  She has had high blood pressure since the age of 54.  BP has always been SBP 190-200.  Notes that she has had allergies to many medications.  The only she remembers is that she had LE edema with one and another made her sleepy that has limited her sleep.  Notes a history of OSA for which she has started probing her self up on pillows with improved symptoms.  Has had no chest pain, chest pressure, chest tightness, chest stinging .   No shortness of breath, DOE .  No PND or orthopnea.  Notes no change in weight, leg swelling , or abdominal swelling.  No syncope or near syncope . Notes no palpitations or funny heart beats.   No leg claudication.  Notes no HA despite high BP   Past Medical History:  Diagnosis Date   Chest pain 2010   hospitalization and eval   Environmental allergies    Hypertension    age 48 yo   Obesity    Wears glasses     Past Surgical History:  Procedure Laterality Date   NO PAST SURGERIES  06/2015    Current Medications: Current Meds  Medication Sig   Ascorbic Acid (VITA-C PO) Take 1 tablet by mouth daily.    carvedilol (COREG) 25 MG tablet TAKE 1 TABLET(25 MG) BY MOUTH TWICE DAILY WITH A MEAL   ferrous sulfate 325 (65 FE) MG tablet Take 325 mg by mouth daily with breakfast.   Flaxseed, Linseed, (FLAXSEED OIL PO) Take 1 capsule by mouth daily.   hydrALAZINE (APRESOLINE) 25 MG tablet Take 1 tablet (25 mg total) by mouth in the morning and at  bedtime.   Multiple Vitamin (MULTIVITAMIN WITH MINERALS) TABS tablet Take 1 tablet by mouth daily.   spironolactone (ALDACTONE) 25 MG tablet TAKE 1 TABLET(25 MG) BY MOUTH DAILY   valsartan-hydrochlorothiazide (DIOVAN-HCT) 320-25 MG tablet Take 1 tablet by mouth daily.     Allergies:   Ibuprofen, Kiwi extract, and Shrimp [shellfish allergy]   Social History   Socioeconomic History   Marital status: Single    Spouse name: Not on file   Number of children: Not on file   Years of education: Not on file   Highest education level: Not on file  Occupational History   Not on file  Tobacco Use   Smoking status: Never   Smokeless tobacco: Never  Vaping Use   Vaping Use: Never used  Substance and Sexual Activity   Alcohol use: No   Drug use: No   Sexual activity: Never  Other Topics Concern   Not on file  Social History Narrative   Lives with mother.   Works as Research scientist (medical) at [employer], and Public librarian at [employer].  Exercise - goes to [employer], treadmill and weights, goes 2-3 times per week.   Diet - consistent with 3 meals, but sometimes  could be better.  No significant other. No children.    Social Determinants of Health   Financial Resource Strain: Not on file  Food Insecurity: Not on file  Transportation Needs: Not on file  Physical Activity: Not on file  Stress: Not on file  Social Connections: Not on file    Social: moved from [place] ([place])  Family History: The patient's family history includes Asthma in her sister; Cancer in her maternal aunt; Diabetes in her maternal grandfather, maternal grandmother, and mother; Heart disease in her maternal grandmother; Hypertension in her father, maternal grandmother, and mother; Other in her father. Father and Grandparents had refractory HTN  ROS:   Please see the history of present illness.     All other systems reviewed and are negative.  EKGs/Labs/Other Studies Reviewed:    The following studies  were reviewed today:  EKG:  EKG is  ordered today.  The ekg ordered today demonstrates  08/19/21: LVE with Secondary repolarization   Transthoracic Echocardiogram: Date: 08/12/2008 Results: Per report LEFT VENTRICLE:   -  Left ventricular size was normal.   -  Overall left ventricular systolic function was normal.   -  Left ventricular ejection fraction was estimated , range being 50         % to 55 %.   -  There were no left ventricular regional wall motion         abnormalities.   -  Left ventricular wall thickness was increased in a pattern of         mild concentric hypertrophy.     Recent Labs: 03/09/2021: ALT 14 04/16/2021: BUN 12; Creatinine, Ser 0.91; Sodium 137 05/29/2021: Potassium 3.9  Recent Lipid Panel    Component Value Date/Time   CHOL 176 06/10/2015 0940   TRIG 63 06/10/2015 0940   HDL 56 06/10/2015 0940   CHOLHDL 3.1 06/10/2015 0940   VLDL 13 06/10/2015 0940   LDLCALC 107 06/10/2015 0940    Physical Exam:    VS:  BP (!) 220/121 Comment: rightarm  Pulse 80   Ht 5\' 3"  (1.6 m)   Wt (!) 346 lb (156.9 kg)   SpO2 99%   BMI 61.29 kg/m     Wt Readings from Last 3 Encounters:  08/19/21 (!) 346 lb (156.9 kg)  05/29/21 (!) 330 lb 6.4 oz (149.9 kg)  12/09/20 (!) 318 lb 9.6 oz (144.5 kg)     GEN: Obes ewell developed in no acute distress HEENT: Normal NECK: No JVD LYMPHATICS: No lymphadenopathy CARDIAC: RRR, no murmurs, rubs, gallops RESPIRATORY:  Clear to auscultation without rales, wheezing or rhonchi  ABDOMEN: Soft, non-tender, non-distended MUSCULOSKELETAL:  No edema; No deformity  SKIN: Warm and dry NEUROLOGIC:  Alert and oriented x 3 PSYCHIATRIC:  Normal affect   ASSESSMENT:    1. Hypertension, uncontrolled   2. Morbid obesity (HCC)   3. OSA (obstructive sleep apnea)    PLAN:    Refractory HTN Morbid Obesity OSA - continue Valsatran HCTZ 320 and 25, Aldactoe 25 mg Po daily, and Coreg - will need renal artery duplex, repeat sleep study,  Conn's syndrome eval and Pheo eval:  she has had some of this work up in the past with Dr. 02/06/21 office and will work to get old records - discussed repeat sleep testing and weight loss - starting hydralazine 25mg  PO BID - will sent to HTN clinic - if CP, SOB, HA, changes in vision will need ED eval  In addition to HTN  clinic, will see in 3 months and will order interval testing based on records from Dr. Jacinto Halim (once received)  Medication Adjustments/Labs and Tests Ordered: Current medicines are reviewed at length with the patient today.  Concerns regarding medicines are outlined above.  Orders Placed This Encounter  Procedures   AMB Referral to [facility]   EKG 12-Lead   Meds ordered this encounter  Medications   hydrALAZINE (APRESOLINE) 25 MG tablet    Sig: Take 1 tablet (25 mg total) by mouth in the morning and at bedtime.    Dispense:  180 tablet    Refill:  3    Patient Instructions  Medication Instructions:  Your physician has recommended you make the following change in your medication:  START: hydralazine 25 by mouth twice daily  *If you need a refill on your cardiac medications before your next appointment, please call your pharmacy*   Lab Work: NONE If you have labs (blood work) drawn today and your tests are completely normal, you will receive your results only by: MyChart Message (if you have MyChart) OR A paper copy in the mail If you have any lab test that is abnormal or we need to change your treatment, we will call you to review the results.   Testing/Procedures: Your physician has referred you to the Hypertension Clinic in 1-2 weeks   Follow-Up: At [facility], you and your health needs are our priority.  As part of our continuing mission to provide you with exceptional heart care, we have created designated Provider Care Teams.  These Care Teams include your primary Cardiologist (physician) and Advanced Practice Providers (APPs -  Physician  Assistants and Nurse Practitioners) who all work together to provide you with the care you need, when you need it.  We recommend signing up for the patient portal called "MyChart".  Sign up information is provided on this After Visit Summary.  MyChart is used to connect with patients for Virtual Visits (Telemedicine).  Patients are able to view lab/test results, encounter notes, upcoming appointments, etc.  Non-urgent messages can be sent to your provider as well.   To learn more about what you can do with MyChart, go to [email or web address].    Your next appointment:   3-4  month(s)  The format for your next appointment:   In Person  Provider:   You may see Riley Lam, MD or one of the following Advanced Practice Providers on your designated Care Team:   Ronie Spies, PA-C Jacolyn Reedy, PA-C    Signed, Christell Constant, MD  08/19/2021 5:00 PM    [facility]

## 2021-08-19 ENCOUNTER — Other Ambulatory Visit: Payer: Self-pay

## 2021-08-19 ENCOUNTER — Ambulatory Visit: Payer: BC Managed Care – PPO | Admitting: Internal Medicine

## 2021-08-19 ENCOUNTER — Encounter: Payer: Self-pay | Admitting: Internal Medicine

## 2021-08-19 VITALS — BP 220/121 | HR 80 | Ht 63.0 in | Wt 346.0 lb

## 2021-08-19 DIAGNOSIS — G4733 Obstructive sleep apnea (adult) (pediatric): Secondary | ICD-10-CM

## 2021-08-19 DIAGNOSIS — I10 Essential (primary) hypertension: Secondary | ICD-10-CM

## 2021-08-19 MED ORDER — HYDRALAZINE HCL 25 MG PO TABS: 25.0000 mg | ORAL_TABLET | Freq: Two times a day (BID) | ORAL | 3 refills | Status: DC

## 2021-08-19 NOTE — Patient Instructions (Signed)
Medication Instructions:  Your physician has recommended you make the following change in your medication:  START: hydralazine 25 by mouth twice daily  *If you need a refill on your cardiac medications before your next appointment, please call your pharmacy*   Lab Work: NONE If you have labs (blood work) drawn today and your tests are completely normal, you will receive your results only by: MyChart Message (if you have MyChart) OR A paper copy in the mail If you have any lab test that is abnormal or we need to change your treatment, we will call you to review the results.   Testing/Procedures: Your physician has referred you to the Hypertension Clinic in 1-2 weeks   Follow-Up: At [facility], you and your health needs are our priority.  As part of our continuing mission to provide you with exceptional heart care, we have created designated Provider Care Teams.  These Care Teams include your primary Cardiologist (physician) and Advanced Practice Providers (APPs -  Physician Assistants and Nurse Practitioners) who all work together to provide you with the care you need, when you need it.  We recommend signing up for the patient portal called "MyChart".  Sign up information is provided on this After Visit Summary.  MyChart is used to connect with patients for Virtual Visits (Telemedicine).  Patients are able to view lab/test results, encounter notes, upcoming appointments, etc.  Non-urgent messages can be sent to your provider as well.   To learn more about what you can do with MyChart, go to [email or web address].    Your next appointment:   3-4  month(s)  The format for your next appointment:   In Person  Provider:   You may see Riley Lam, MD or one of the following Advanced Practice Providers on your designated Care Team:   Ronie Spies, PA-C Jacolyn Reedy, PA-C

## 2021-09-02 ENCOUNTER — Ambulatory Visit: Payer: BC Managed Care – PPO

## 2021-09-14 ENCOUNTER — Other Ambulatory Visit: Payer: Self-pay

## 2021-09-14 ENCOUNTER — Ambulatory Visit (INDEPENDENT_AMBULATORY_CARE_PROVIDER_SITE_OTHER): Payer: BC Managed Care – PPO

## 2021-09-14 VITALS — BP 160/96 | HR 77

## 2021-09-14 DIAGNOSIS — I10 Essential (primary) hypertension: Secondary | ICD-10-CM | POA: Diagnosis not present

## 2021-09-14 MED ORDER — HYDRALAZINE HCL 25 MG PO TABS: 25.0000 mg | ORAL_TABLET | Freq: Three times a day (TID) | ORAL | 3 refills | Status: DC

## 2021-09-14 NOTE — Patient Instructions (Addendum)
Your blood pressure today was 160/96 mmHg which is above goal of < 130/80 mmHg. Please obtain a home BP cuff and check your blood pressure daily for [place]. Please bring this log with you to your next appointment. Omron is a good brand of blood pressure cuff.  The muscle cramps you are experiencing may be due to low or high electrolytes. We will have you get labs on the way out. I will call you with results over the next couple of days.  Please increase your hydralazine to 25 mg three times a day. We will submit paperwork to the insurance for the weight loss medicine called Wegovy and will call you when we find out if it is covered.    You have a follow-up phone appointment on 10/01/21 at 8am.

## 2021-09-14 NOTE — Progress Notes (Signed)
Patient ID: Kathy [name]                 DOB: 30-Nov-1980                      MRN: [id number]     HPI: Kathy [name] is a 40 y.o. female referred by Dr. Izora Ribas to HTN clinic. PMH is significant for  juvenille HTN with high BP since age 11 (SBP 190-200 mmHg), Morbid Obesity, and OSA.  She was last seen by Dr. Izora Ribas on 08/19/21 where she noted she had many adverse reactions to HTN medications, but was unable to remember the names. She notes she has had LE edema and sleepiness to different medications. Hydralazine 25 mg PO BID was initiated. These adverse reactions are not documented in the chart as she moved from [place].   She arrives today in good spirits. She states she is doing okay since starting the hydralazine, but complains of muscle cramps in her feet/calves that have started since around September and that she thinks her potassium may be abnormal due to her muscle cramps and that she read spironolactone can increase her potassium. Denies any other issues with her medications. We discussed weight management as a modifable risk factor for BP control and possibility of weight loss medications to help with BP and weight.   Current HTN meds: Valsartan-HCTZ 320/25 mg daily, spironolactone 25 mg daily, carvedilol 25 mg BID, hydralazine 25 mg BID Previously tried: Atnolol, losartan 50 mg BP goal: <130/80 mmHg   Family History: Asthma in her sister; Cancer in her maternal aunt; Diabetes in her maternal grandfather, maternal grandmother, and mother; Heart disease in her maternal grandmother; Hypertension in her father, maternal grandmother, and mother; Other in her father. Father and Grandparents had refractory HTN  Diet: vegetarian since 2018, shakes/breakfast, granola, coffee/tea, avoids table salt, [place] lunch meat  Exercise: not lately  Home BP readings:  Does not have one at home  Labs: Scr 0.91 K 4.2, 04/2021  Wt Readings from Last 3 Encounters:  08/19/21 (!) 346 lb  (156.9 kg)  05/29/21 (!) 330 lb 6.4 oz (149.9 kg)  12/09/20 (!) 318 lb 9.6 oz (144.5 kg)   BP Readings from Last 3 Encounters:  08/19/21 (!) 220/121  05/29/21 (!) 159/105  04/09/21 (!) 179/127   Pulse Readings from Last 3 Encounters:  08/19/21 80  05/29/21 83  04/09/21 80    Renal function: CrCl cannot be calculated (Patient's most recent lab result is older than the maximum 21 days allowed.).  Past Medical History:  Diagnosis Date   Chest pain 2010   hospitalization and eval   Environmental allergies    Hypertension    age 40 yo   Obesity    Wears glasses     Current Outpatient Medications on File Prior to Visit  Medication Sig Dispense Refill   Ascorbic Acid (VITA-C PO) Take 1 tablet by mouth daily.      carvedilol (COREG) 25 MG tablet TAKE 1 TABLET(25 MG) BY MOUTH TWICE DAILY WITH A MEAL 60 tablet 3   ferrous sulfate 325 (65 FE) MG tablet Take 325 mg by mouth daily with breakfast.     Flaxseed, Linseed, (FLAXSEED OIL PO) Take 1 capsule by mouth daily.     hydrALAZINE (APRESOLINE) 25 MG tablet Take 1 tablet (25 mg total) by mouth in the morning and at bedtime. 180 tablet 3   Multiple Vitamin (MULTIVITAMIN WITH MINERALS) TABS tablet Take 1 tablet  by mouth daily.     spironolactone (ALDACTONE) 25 MG tablet TAKE 1 TABLET(25 MG) BY MOUTH DAILY 90 tablet 0   valsartan-hydrochlorothiazide (DIOVAN-HCT) 320-25 MG tablet Take 1 tablet by mouth daily. 90 tablet 3   No current facility-administered medications on file prior to visit.    Allergies  Allergen Reactions   Ibuprofen Swelling   Kiwi Extract Nausea And Vomiting   Shrimp [Shellfish Allergy] Swelling     Assessment/Plan:  1. Hypertension -  Blood pressure today was 160/96 mmHg which is improved, but above goal < 130/80 mmHg. Blood pressure requires further optimization. Hydralazine increased to 25 mg TID. Due to muscle cramps sent the patient to get BMET and Mg checked today. Ideally would like to increase  spironolactone to 50 mg daily if Scr and K okay. Patient counseled to continue to limit salt and caffeine. Does not use NSAIDs as allergic to ibuprofen (swelling). Encouraged aerobic exercise of at least 30-60 minutes 5 times a day. Instructed patient to obtain BP cuff, recommended Omron cuff to use on forearm and check BP daily and maintain a log to help [place] titrate medication. Recommended Wegovy for weight loss, which will also help with BP. Patient is interested. Will check with her insurance to see if it is covered. Follow-up appointment via phone on 10/01/21 at 8am.  Seen with Malena Peer, PharmD, CPP.   Drake Leach, PharmD PGY2 Cardiology Pharmacy Resident

## 2021-09-15 ENCOUNTER — Telehealth: Payer: Self-pay | Admitting: Pharmacist

## 2021-09-15 LAB — BASIC METABOLIC PANEL
BUN/Creatinine Ratio: 17 (ref 9–23)
BUN: 14 mg/dL (ref 6–20)
CO2: 19 mmol/L — ABNORMAL LOW (ref 20–29)
Calcium: 9 mg/dL (ref 8.7–10.2)
Chloride: 102 mmol/L (ref 96–106)
Creatinine, Ser: 0.83 mg/dL (ref 0.57–1.00)
Glucose: 95 mg/dL (ref 70–99)
Potassium: 4 mmol/L (ref 3.5–5.2)
Sodium: 138 mmol/L (ref 134–144)
eGFR: 92 mL/min/{1.73_m2} (ref >59)

## 2021-09-15 LAB — MAGNESIUM: Magnesium: 2 mg/dL (ref 1.6–2.3)

## 2021-09-15 NOTE — Telephone Encounter (Addendum)
Attempted PA for [facility]. Came back, drug covered without authorization. We will discuss further with patient at next visit. May need to start with Saxenda until shortage is resolved.

## 2021-09-21 ENCOUNTER — Ambulatory Visit: Payer: BC Managed Care – PPO | Admitting: Nurse Practitioner

## 2021-09-21 NOTE — Telephone Encounter (Signed)
Called patient 09/15/2021 to discuss lab results. Informed patient BMP and Mg were normal. Instructed the patient to continue her medications with the hydralazine three times a day. Will keep spironolactone at 25 mg daily for now, given possible muscle cramps and will assess at next visit. Patient also informed that Reginal Lutes is covered without PA and will discuss at her next visit.

## 2021-09-21 NOTE — Telephone Encounter (Signed)
Called patient 09/21/21 to clarify what dose of spironolactone she is taking as provider commented on lab work instructing her she can increase her dose to 50 mg. May not have seen telephone note indicating her to leave spironolactone as is at 25 mg given possible muscle cramps.

## 2021-09-22 ENCOUNTER — Telehealth: Payer: Self-pay

## 2021-09-22 DIAGNOSIS — I10 Essential (primary) hypertension: Secondary | ICD-10-CM

## 2021-09-22 MED ORDER — SPIRONOLACTONE 50 MG PO TABS: 50.0000 mg | ORAL_TABLET | Freq: Every day | ORAL | 3 refills | Status: DC

## 2021-09-22 NOTE — Telephone Encounter (Signed)
-----   Message from Christell Constant, MD sent at 09/16/2021  2:51 PM EST ----- Results: WNL Plan: OK for Aldactone increase to 50 and labs in 1-2 weeks  Christell Constant, MD

## 2021-09-22 NOTE — Telephone Encounter (Signed)
Pt has reviewed results and MD recommendations released via my chart.  I called and left a detailed message (Ok per [facility]) with MD recommendation to increase aldactone to 50 mg PO QD. I advised pt to call in or send in a mychart message to schedule f/u lab work due in 1-2 weeks.

## 2021-10-01 ENCOUNTER — Telehealth: Payer: BC Managed Care – PPO

## 2021-10-02 ENCOUNTER — Ambulatory Visit (INDEPENDENT_AMBULATORY_CARE_PROVIDER_SITE_OTHER): Payer: BC Managed Care – PPO | Admitting: Pharmacist

## 2021-10-02 ENCOUNTER — Other Ambulatory Visit: Payer: BC Managed Care – PPO

## 2021-10-02 ENCOUNTER — Other Ambulatory Visit: Payer: Self-pay

## 2021-10-02 DIAGNOSIS — I10 Essential (primary) hypertension: Secondary | ICD-10-CM

## 2021-10-02 NOTE — Progress Notes (Signed)
Patient ID: Kathy [name]                 DOB: May 27, 1981                      MRN: [id number]     HPI: Kathy [name] is a 40 y.o. female referred by Dr. Izora Ribas to HTN clinic. PMH is significant for  juvenille HTN with high BP since age 32 (SBP 190-200 mmHg), Morbid Obesity, and OSA.  She was last seen by Dr. Izora Ribas on 08/19/21 where she noted she had many adverse reactions to HTN medications, but was unable to remember the names. She notes she has had LE edema and sleepiness to different medications. Hydralazine 25 mg PO BID was initiated. These adverse reactions are not documented in the chart as she moved from [place].   At last visit with PharmD hydralazine was increased to 25mg  three times a day. Her spironolactone was also increased to 50mg  daily.  Today's visit was done via phone call due to patients work schedule and inability to come to the office. She has not been checking her BP at home. Has not yet gotten a cuff. She only started spironolactone 50mg  daily yesterday. She has been taking hydralazine three times a day. She has a gym membership to [employer] but has not been going.   Current HTN meds: valsartan-HCTZ 320/25 mg daily, spironolactone 50 mg daily, carvedilol 25 mg BID, hydralazine 25 mg three times a day. Previously tried: Atnolol, losartan 50 mg BP goal: <130/80 mmHg   Family History: Asthma in her sister; Cancer in her maternal aunt; Diabetes in her maternal grandfather, maternal grandmother, and mother; Heart disease in her maternal grandmother; Hypertension in her father, maternal grandmother, and mother; Other in her father. Father and Grandparents had refractory HTN  Diet: vegetarian since 2018, shakes/breakfast, granola, coffee/tea, avoids table salt, Breakfast: BP and crackers, tofou sandwich, applesauce Lunch: beyond chicken rice, curly fries, veggie sub Dinner: beans and greens  Drink: herbal tea w/ equal, coffee, does not drink soda regularly,  water Snacks: candy, fruit snacks, apple sauce, fruit jerkies   Exercise: has a gym membership  Home BP readings:  Does not have one at home  Labs: Scr 0.91 K 4.2, 04/2021  Wt Readings from Last 3 Encounters:  08/19/21 (!) 346 lb (156.9 kg)  05/29/21 (!) 330 lb 6.4 oz (149.9 kg)  12/09/20 (!) 318 lb 9.6 oz (144.5 kg)   BP Readings from Last 3 Encounters:  09/14/21 (!) 160/96  08/19/21 (!) 220/121  05/29/21 (!) 159/105   Pulse Readings from Last 3 Encounters:  09/14/21 77  08/19/21 80  05/29/21 83    Renal function: CrCl cannot be calculated (Unknown ideal weight.).  Past Medical History:  Diagnosis Date   Chest pain 2010   hospitalization and eval   Environmental allergies    Hypertension    age 6 yo   Obesity    Wears glasses     Current Outpatient Medications on File Prior to Visit  Medication Sig Dispense Refill   Ascorbic Acid (VITA-C PO) Take 1 tablet by mouth daily.      carvedilol (COREG) 25 MG tablet TAKE 1 TABLET(25 MG) BY MOUTH TWICE DAILY WITH A MEAL 60 tablet 3   ferrous sulfate 325 (65 FE) MG tablet Take 325 mg by mouth daily with breakfast.     Flaxseed, Linseed, (FLAXSEED OIL PO) Take 1 capsule by mouth daily.  hydrALAZINE (APRESOLINE) 25 MG tablet Take 1 tablet (25 mg total) by mouth 3 (three) times daily. 270 tablet 3   Multiple Vitamin (MULTIVITAMIN WITH MINERALS) TABS tablet Take 1 tablet by mouth daily.     spironolactone (ALDACTONE) 50 MG tablet Take 1 tablet (50 mg total) by mouth daily. 90 tablet 3   valsartan-hydrochlorothiazide (DIOVAN-HCT) 320-25 MG tablet Take 1 tablet by mouth daily. 90 tablet 3   No current facility-administered medications on file prior to visit.    Allergies  Allergen Reactions   Ibuprofen Swelling   Kiwi Extract Nausea And Vomiting   Shrimp [Shellfish Allergy] Swelling     Assessment/Plan:  1. Hypertension -  No blood pressure readings available. She just started spironolactone yesterday. I have  rescheduled her labs for 12/15. I have asked her to please get a blood pressure cuff as I cannot make changes without any blood pressure readings, especially if she does not come into clinic for visits. She states she will get one off of [employer]. Follow up via telephone in 3.5 weeks. Continue valsartan-HCTZ 320/25 mg daily, spironolactone 50 mg daily, carvedilol 25 mg BID and hydralazine 25 mg three times a day  2. Obesity- Patient's insurance covers [phone] (unless there is a change for next year we do not know about). Confirmed patient has no personal or family history of medullary thyroid carcinoma (MTC) or Multiple Endocrine Neoplasia syndrome type 2 (MEN 2). No hx of pancreatitis.  Advised patient on common side effects including nausea, diarrhea, dyspepsia, decreased appetite, and fatigue. Counseled patient on reducing meal size and how to titrate medication to minimize side effects.   Discussed that currently the lower doses of Wegovy are on back order. We would need to start Saxenda and transition to [facility]. Kathy [name] would be once daily. The shortage of Reginal Lutes is supposed to be resolved come Jan. Patient would like to wait until January.  Discussed patients diet and physical activity with her. I encouraged her to go to the gym before work since she is usually busy or tired after work. We talked about the physical and mental benefits of exercise. I warned against the "beyond meats" and the vegetarian meat alternatives as they are often very processed, high in sodium and sugar. Encouraged her to pay attention to food labels for added sugars and sodium, eat unsweetened apple sauce, limit candy and increase her beans and vegetable intake (less potatoes and bread).  Thank you,  Kathy [name], Pharm.D, BCPS, CPP [facility]  [address] [place], [place] [address]  Phone: [phone]; Fax: [phone]

## 2021-10-04 ENCOUNTER — Other Ambulatory Visit: Payer: Self-pay | Admitting: Family Medicine

## 2021-10-04 DIAGNOSIS — I10 Essential (primary) hypertension: Secondary | ICD-10-CM

## 2021-10-04 NOTE — Telephone Encounter (Signed)
dc'd 09/22/21 dose changed  Elmer Sow RN  Requested Prescriptions  Refused Prescriptions Disp Refills  . spironolactone (ALDACTONE) 25 MG tablet [Pharmacy Med Name: SPIRONOLACTONE 25MG  TABLETS] 90 tablet 0    Sig: TAKE 1 TABLET(25 MG) BY MOUTH DAILY     Cardiovascular: Diuretics - Aldosterone Antagonist Failed - 10/04/2021  6:23 AM      Failed - Last BP in normal range    BP Readings from Last 1 Encounters:  09/14/21 (!) 160/96         Passed - Cr in normal range and within 360 days    Creat  Date Value Ref Range Status  01/05/2016 0.74 0.50 - 1.10 mg/dL Final   Creatinine, Ser  Date Value Ref Range Status  09/14/2021 0.83 0.57 - 1.00 mg/dL Final         Passed - K in normal range and within 360 days    Potassium  Date Value Ref Range Status  09/14/2021 4.0 3.5 - 5.2 mmol/L Final         Passed - Na in normal range and within 360 days    Sodium  Date Value Ref Range Status  09/14/2021 138 134 - 144 mmol/L Final         Passed - Valid encounter within last 6 months    Recent Outpatient Visits          5 months ago Accelerated hypertension   [place] [facility], Lois Huxley, RPH-CPP   6 months ago Accelerated hypertension   [facility], RPH-CPP   7 months ago Accelerated hypertension   [facility], Lois Huxley, RPH-CPP      Future Appointments            In 3 months Cornelius Moras, Izora Ribas, MD [facility] [address] Office, [facility]

## 2021-10-06 ENCOUNTER — Encounter: Payer: Self-pay | Admitting: Family

## 2021-10-07 ENCOUNTER — Ambulatory Visit (INDEPENDENT_AMBULATORY_CARE_PROVIDER_SITE_OTHER): Payer: BC Managed Care – PPO | Admitting: Nurse Practitioner

## 2021-10-07 ENCOUNTER — Encounter: Payer: Self-pay | Admitting: Nurse Practitioner

## 2021-10-07 ENCOUNTER — Other Ambulatory Visit: Payer: Self-pay

## 2021-10-07 DIAGNOSIS — J0100 Acute maxillary sinusitis, unspecified: Secondary | ICD-10-CM | POA: Diagnosis not present

## 2021-10-07 MED ORDER — AMOXICILLIN 875 MG PO TABS: 875.0000 mg | ORAL_TABLET | Freq: Two times a day (BID) | ORAL | 0 refills | Status: AC

## 2021-10-07 MED ORDER — CETIRIZINE HCL 10 MG PO TABS: 10.0000 mg | ORAL_TABLET | Freq: Every day | ORAL | 11 refills | Status: AC

## 2021-10-07 NOTE — Progress Notes (Signed)
Virtual Visit via Telephone Note  I connected with [name] on 10/07/21 at 10:40 AM EST by telephone and verified that I am speaking with the correct person using two identifiers.  Location: Patient: home Provider: office   I discussed the limitations, risks, security and privacy concerns of performing an evaluation and management service by telephone and the availability of in person appointments. I also discussed with the patient that there may be a patient responsible charge related to this service. The patient expressed understanding and agreed to proceed.   History of Present Illness:  Patient presents today for sick visit through telephone visit.  Patient states that her symptoms started this past Monday.  She states that she has been having left ear pain, sore throat, sinus drainage, sinus pressure and pain.  She denies any significant fever. Denies f/c/s, n/v/d, hemoptysis, PND, chest pain or edema.     Observations/Objective:  Vitals with BMI 09/14/2021 08/19/2021 08/19/2021  Height - - 5\' 3"   Weight - - 346 lbs  BMI - - 61.31  Systolic 160 220  Diastolic 96 121 104  Pulse 77 - 80      Assessment and Plan:  Left Ear Pain Sinusitis:  Stay well hydrated  Stay active  May take tylenol for fever or pain  May take mucinex DM twice daily  Will order Zyrtec  Will order amoxicillin   Follow up:  Follow up if needed    I discussed the assessment and treatment plan with the patient. The patient was provided an opportunity to ask questions and all were answered. The patient agreed with the plan and demonstrated an understanding of the instructions.   The patient was advised to call back or seek an in-person evaluation if the symptoms worsen or if the condition fails to improve as anticipated.  I provided 23 minutes of non-face-to-face time during this encounter.   423, NP

## 2021-10-07 NOTE — Patient Instructions (Signed)
Left Ear Pain Sinusitis:  Stay well hydrated  Stay active  May take tylenol for fever or pain  May take mucinex DM twice daily  Will order Zyrtec  Will order amoxicillin   Follow up:  Follow up if needed

## 2021-10-15 ENCOUNTER — Other Ambulatory Visit: Payer: BC Managed Care – PPO

## 2021-10-20 ENCOUNTER — Other Ambulatory Visit: Payer: BC Managed Care – PPO

## 2021-10-28 ENCOUNTER — Encounter: Payer: Self-pay | Admitting: Pharmacist

## 2021-10-28 ENCOUNTER — Ambulatory Visit (INDEPENDENT_AMBULATORY_CARE_PROVIDER_SITE_OTHER): Payer: BC Managed Care – PPO | Admitting: Pharmacist

## 2021-10-28 ENCOUNTER — Other Ambulatory Visit: Payer: Self-pay

## 2021-10-28 DIAGNOSIS — Z6841 Body Mass Index (BMI) 40.0 and over, adult: Secondary | ICD-10-CM

## 2021-10-28 MED ORDER — PEN NEEDLES 32G X 6 MM MISC: 1.0000 "pen " | Freq: Every day | 0 refills | Status: DC

## 2021-10-28 MED ORDER — HYDRALAZINE HCL 50 MG PO TABS: 50.0000 mg | ORAL_TABLET | Freq: Three times a day (TID) | ORAL | 3 refills | Status: DC

## 2021-10-28 MED ORDER — SAXENDA 18 MG/3ML ~~LOC~~ SOPN: 3.0000 mg | PEN_INJECTOR | Freq: Every day | SUBCUTANEOUS | 1 refills | Status: DC

## 2021-10-28 NOTE — Progress Notes (Signed)
Patient ID: JEZEL BASTO                 DOB: Nov 17, 1980                      MRN: [id number]     HPI: Kathy [name] is a 40 y.o. female referred by Dr. Izora Ribas to HTN clinic. PMH is significant for  juvenille HTN with high BP since age 64 (SBP 190-200 mmHg), Morbid Obesity, and OSA.  She was last seen by Dr. Izora Ribas on 08/19/21 where she noted she had many adverse reactions to HTN medications, but was unable to remember the names. She notes she has had LE edema and sleepiness to different medications. Hydralazine 25 mg PO BID was initiated. These adverse reactions are not documented in the chart as she moved from [place].   At in person visit with PharmD on 11/14 hydralazine was increased to 25mg  three times a day. Her spironolactone was also increased to 50mg  daily. At follow up phone visit on 12/2, patient had just started the higher dose of spironolactone the day prior. She also had not purchased a blood pressure cuff, therefore no blood pressure readings were available. 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  Today's visit was done via phone call due to patients work schedule and inability to come to the office. She has started checking her blood pressure. Mainly in the 150's/90's. Higher when she goes out to eat with her family. Has been walking with her mom, but hasn't been going to the gym. Is interested in starting a GLP-1 for weight loss. Denies dizziness, lightheadedness, headache, blurred vision, SOB or swelling. Her old PCP was . Thinks she was on clonidine in the past (caused drowsiness) and amlodipine (? Swelling).  Current HTN meds: valsartan-HCTZ 320/25 mg daily, spironolactone 50 mg daily, carvedilol 25 mg BID, hydralazine 25 mg three times a day. Previously tried: Atnolol, losartan 50 mg, clonidine (drowsiness) amlodipine (swelling) BP goal:  <130/80 mmHg   Family History: Asthma in her sister; Cancer in her maternal aunt; Diabetes in her maternal grandfather, maternal grandmother, and mother; Heart disease in her maternal grandmother; Hypertension in her father, maternal grandmother, and mother; Other in her father. 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  Past Medical History:  Diagnosis Date   Chest pain 2010   hospitalization and eval  Environmental allergies    Hypertension    age 48 yo   Obesity    Wears glasses     Current Outpatient Medications on File Prior to Visit  Medication Sig Dispense Refill   Ascorbic Acid (VITA-C PO) Take 1 tablet by mouth daily.      carvedilol (COREG) 25 MG tablet TAKE 1 TABLET(25 MG) BY MOUTH TWICE DAILY WITH A MEAL 60 tablet 3   cetirizine (ZYRTEC) 10 MG tablet Take 1 tablet (10 mg total) by mouth daily. 30 tablet 11   ferrous sulfate 325 (65 FE) MG tablet Take 325 mg by mouth daily with breakfast.     Flaxseed, Linseed, (FLAXSEED OIL PO)  Take 1 capsule by mouth daily.     hydrALAZINE (APRESOLINE) 25 MG tablet Take 1 tablet (25 mg total) by mouth 3 (three) times daily. 270 tablet 3   Multiple Vitamin (MULTIVITAMIN WITH MINERALS) TABS tablet Take 1 tablet by mouth daily.     spironolactone (ALDACTONE) 50 MG tablet Take 1 tablet (50 mg total) by mouth daily. 90 tablet 3   valsartan-hydrochlorothiazide (DIOVAN-HCT) 320-25 MG tablet Take 1 tablet by mouth daily. 90 tablet 3   No current facility-administered medications on file prior to visit.    Allergies  Allergen Reactions   Ibuprofen Swelling   Kiwi Extract Nausea And Vomiting   Shrimp [Shellfish Allergy] Swelling     Assessment/Plan:  1. Hypertension -  Blood pressure still above goal of <130/80 but improved. She needs to have her BMP checked due to increased dose of spironolactone. She missed her lab appointment. She will come on Friday. Increase hydralazine to 50mg  three times a day. Increase exercise. Continue checking blood pressure and follow up in 3 weeks.  2. Obesity- Patient's insurance covers ([phone] (unless there is a change for next year we do not know about). Confirmed patient has no personal or family history of medullary thyroid carcinoma (MTC) or Multiple Endocrine Neoplasia syndrome type 2 (MEN 2). No hx of pancreatitis.  Advised patient on common side effects including nausea, diarrhea, dyspepsia, decreased appetite, and fatigue. Counseled patient on reducing meal size and how to titrate medication to minimize side effects.   Discussed that currently the lower doses of Wegovy are on back order. We would need to start Saxenda and transition to [facility]. [facility] would be once daily. The shortage of Bernie Covey is supposed to be resolved come mid Jan. Patient would like to start Saxenda and transition to [facility].  Discussed patients diet and physical activity with her. I encouraged her to go to the gym before work since she is usually busy or tired after work. We  talked about the physical and mental benefits of exercise. I warned against the "beyond meats" and the vegetarian meat alternatives as they are often very processed, high in sodium and sugar. Encouraged her to pay attention to food labels for added sugars and sodium, eat unsweetened apple sauce, limit candy and increase her beans and vegetable intake (less potatoes and bread).  Saxenda 0.6mg  daily x 1 week Saxenda 1.2mg  daily x 1 week Saxenda 1.8mg  daily x 1 week Saxenda 2.4mg  daily x 1 week Saxenda 3mg  daily x 2 weeks Wegovy 1.7mg  weekly x 4 weeks Wegovy 2.4mg  weekly  I will call patient in 2 weeks to see how she is doing.  Thank you,  , Pharm.D, BCPS, CPP [facility]  [address], [place], [address] [place]  Phone: [phone]; Fax: [phone]

## 2021-10-30 ENCOUNTER — Other Ambulatory Visit: Payer: Self-pay

## 2021-10-30 ENCOUNTER — Other Ambulatory Visit: Payer: BC Managed Care – PPO | Admitting: *Deleted

## 2021-10-30 DIAGNOSIS — I10 Essential (primary) hypertension: Secondary | ICD-10-CM

## 2021-10-30 DIAGNOSIS — Z6841 Body Mass Index (BMI) 40.0 and over, adult: Secondary | ICD-10-CM

## 2021-10-31 LAB — BASIC METABOLIC PANEL
BUN/Creatinine Ratio: 17 (ref 9–23)
BUN: 14 mg/dL (ref 6–20)
CO2: 22 mmol/L (ref 20–29)
Calcium: 8.9 mg/dL (ref 8.7–10.2)
Chloride: 101 mmol/L (ref 96–106)
Creatinine, Ser: 0.83 mg/dL (ref 0.57–1.00)
Glucose: 100 mg/dL — ABNORMAL HIGH (ref 70–99)
Potassium: 4.3 mmol/L (ref 3.5–5.2)
Sodium: 137 mmol/L (ref 134–144)
eGFR: 92 mL/min/{1.73_m2} (ref >59)

## 2021-10-31 LAB — LIPID PANEL
Chol/HDL Ratio: 4 ratio (ref 0.0–4.4)
Cholesterol, Total: 174 mg/dL (ref 100–199)
HDL: 43 mg/dL (ref >39)
LDL Chol Calc (NIH): 103 mg/dL — ABNORMAL HIGH (ref 0–99)
Triglycerides: 162 mg/dL — ABNORMAL HIGH (ref 0–149)
VLDL Cholesterol Cal: 28 mg/dL (ref 5–40)

## 2021-10-31 LAB — HEMOGLOBIN A1C
Est. average glucose Bld gHb Est-mCnc: 123 mg/dL
Hgb A1c MFr Bld: 5.9 % — ABNORMAL HIGH (ref 4.8–5.6)

## 2021-11-03 ENCOUNTER — Other Ambulatory Visit: Payer: Self-pay | Admitting: Family Medicine

## 2021-11-03 NOTE — Telephone Encounter (Signed)
Hypertension managed by [facility], last appointment 10/02/2021. Request medication refills from the same.

## 2021-11-17 ENCOUNTER — Other Ambulatory Visit: Payer: Self-pay

## 2021-11-17 ENCOUNTER — Ambulatory Visit (INDEPENDENT_AMBULATORY_CARE_PROVIDER_SITE_OTHER): Payer: BC Managed Care – PPO | Admitting: Cardiology

## 2021-11-17 DIAGNOSIS — I10 Essential (primary) hypertension: Secondary | ICD-10-CM

## 2021-11-17 MED ORDER — VALSARTAN-HYDROCHLOROTHIAZIDE 320-25 MG PO TABS
1.0000 | ORAL_TABLET | Freq: Every day | ORAL | 3 refills | Status: DC
Start: 2021-11-17 — End: 2022-02-17

## 2021-11-17 MED ORDER — SPIRONOLACTONE 50 MG PO TABS: 50.0000 mg | ORAL_TABLET | Freq: Every day | ORAL | 3 refills | Status: DC

## 2021-11-17 MED ORDER — CARVEDILOL 25 MG PO TABS: ORAL_TABLET | ORAL | 3 refills | Status: DC

## 2021-11-17 MED ORDER — HYDRALAZINE HCL 50 MG PO TABS: 50.0000 mg | ORAL_TABLET | Freq: Three times a day (TID) | ORAL | 3 refills | Status: DC

## 2021-11-17 NOTE — Progress Notes (Signed)
Patient ID: ELLIE BRYAND                 DOB: [birth year]                      MRN: [id number]     HPI: Kathy [name] is a 41 y.o. female referred by Dr. Izora Ribas to HTN clinic. PMH is significant for  juvenille HTN with high BP since age 59 (SBP 190-200 mmHg), Morbid Obesity, and OSA.  She was last seen by Dr. Izora Ribas on 08/19/21 where she noted she had many adverse reactions to HTN medications, but was unable to remember the names. She notes she has had LE edema and sleepiness to different medications. Hydralazine 25 mg PO BID was initiated. These adverse reactions are not documented in the chart as she moved from [place].   At in person visit with PharmD on 11/14 hydralazine was increased to 25mg  three times a day. Her spironolactone was also increased to 50mg  daily. At follow up phone visit on 12/2, patient had just started the higher dose of spironolactone the day prior. She also had not purchased a blood pressure cuff, therefore no blood pressure readings were available. 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  At last televisit, patient reported home BP in the 150's/90 with some higher if she went out to eat. Hydralazine was increased to 50mg  three times a day. BMP after increase in spironolactone was stable. Unfortunately, even though her insurance covered GLP-1 for weight loss, the copay was still too high.  Today's follow up today was done via telephone. She denies dizziness, lightheadedness, headache, blurred vision, SOB or swelling. She reports 3 blood pressures- 157/101, 141/107, 159/111. She states that she ran out of Diovan/HCTZ for 2 days. She also reports she is not taking hydralazine as prescribed. First she says she is taking it twice a day and then reports she was only taking once a day. She wants [facility] of [facility].   Current HTN  meds: valsartan-HCTZ 320/25 mg daily, spironolactone 50 mg daily, carvedilol 25 mg BID, hydralazine 50 mg three times a day. Previously tried: Atnolol, losartan 50 mg, clonidine (drowsiness) amlodipine (swelling) BP goal: <130/80 mmHg   Family History: Asthma in her sister; Cancer in her maternal aunt; Diabetes in her maternal grandfather, maternal grandmother, and mother; Heart disease in her maternal grandmother; Hypertension in her father, maternal grandmother, and mother; Other in her father. Father and Grandparents had refractory HTN  Diet: vegetarian since 2018, shakes/breakfast, granola, coffee/tea, avoids table salt, Breakfast: BP and crackers, tofou sandwich, applesauce Lunch: beyond chicken rice, curly fries, veggie sub Dinner: beans and greens  Drink: herbal tea w/ equal, coffee, does not drink soda regularly, water Snacks: candy, fruit snacks, apple sauce, fruit jerkies   Exercise: has a 14/2, walking  Home BP readings:  157/101 141/107 159/111  Labs: Scr 0.83 K4.3 10/30/21 Scr 0.91 K 4.2, 04/2021  Wt Readings from Last 3 Encounters:  08/19/21 (!) 346 lb (156.9 kg)  05/29/21 (!) 330 lb 6.4 oz (149.9 kg)  12/09/20 (!) 318 lb 9.6 oz (144.5 kg)   BP Readings from Last 3 Encounters:  09/14/21 (!) 160/96  08/19/21 (!) 220/121  05/29/21 (!) 159/105   Pulse Readings from Last 3 Encounters:  09/14/21 77  08/19/21 80  05/29/21 83  Renal function: CrCl cannot be calculated (Unknown ideal weight.).  Past Medical History:  Diagnosis Date   Chest pain 2010   hospitalization and eval   Environmental allergies    Hypertension    age 60 yo   Obesity    Wears glasses     Current Outpatient Medications on File Prior to Visit  Medication Sig Dispense Refill   Ascorbic Acid (VITA-C PO) Take 1 tablet by mouth daily.      carvedilol (COREG) 25 MG tablet TAKE 1 TABLET(25 MG) BY MOUTH TWICE DAILY WITH A MEAL 60 tablet 3   cetirizine (ZYRTEC) 10 MG tablet Take  1 tablet (10 mg total) by mouth daily. 30 tablet 11   ferrous sulfate 325 (65 FE) MG tablet Take 325 mg by mouth daily with breakfast.     Flaxseed, Linseed, (FLAXSEED OIL PO) Take 1 capsule by mouth daily.     hydrALAZINE (APRESOLINE) 50 MG tablet Take 1 tablet (50 mg total) by mouth 3 (three) times daily. 270 tablet 3   Insulin Pen Needle (PEN NEEDLES) 32G X 6 MM MISC 1 pen by Does not apply route daily. 90 each 0   Liraglutide -Weight Management (SAXENDA) 18 MG/3ML SOPN Inject 3 mg into the skin daily. 6 mL 1   Multiple Vitamin (MULTIVITAMIN WITH MINERALS) TABS tablet Take 1 tablet by mouth daily.     spironolactone (ALDACTONE) 50 MG tablet Take 1 tablet (50 mg total) by mouth daily. 90 tablet 3   valsartan-hydrochlorothiazide (DIOVAN-HCT) 320-25 MG tablet Take 1 tablet by mouth daily. 90 tablet 3   No current facility-administered medications on file prior to visit.    Allergies  Allergen Reactions   Ibuprofen Swelling   Kiwi Extract Nausea And Vomiting   Shrimp [Shellfish Allergy] Swelling     Assessment/Plan:  1. Hypertension -  Blood pressure still above goal of <130/80. Patient has not been taking hydralazine as prescribed. She states she will start taking hydralazine 50mg  TID starting tomorrow as she has already left for work. Continue valsartan-HCTZ 320/25 mg daily, spironolactone 50 mg daily, carvedilol 25 mg BID. Follow up in 2 weeks via telephone. I will [facility] all cardiac meds [facility].  Thank you,  , Pharm.D, BCPS, CPP [facility]  [address] [place], [place]  Phone: [phone]; Fax: [phone]

## 2021-12-01 ENCOUNTER — Other Ambulatory Visit: Payer: Self-pay

## 2021-12-01 ENCOUNTER — Telehealth: Payer: BC Managed Care – PPO

## 2021-12-22 ENCOUNTER — Encounter: Payer: Self-pay | Admitting: Pharmacist

## 2021-12-22 DIAGNOSIS — I10 Essential (primary) hypertension: Secondary | ICD-10-CM

## 2021-12-22 MED ORDER — SPIRONOLACTONE 100 MG PO TABS: 100.0000 mg | ORAL_TABLET | Freq: Every day | ORAL | 3 refills | Status: DC

## 2021-12-22 NOTE — Addendum Note (Signed)
Addended by: Malena Peer D on: 12/22/2021 10:51 AM   Modules accepted: Orders

## 2021-12-24 ENCOUNTER — Other Ambulatory Visit: Payer: Self-pay

## 2021-12-24 ENCOUNTER — Other Ambulatory Visit: Payer: BC Managed Care – PPO

## 2021-12-24 DIAGNOSIS — I10 Essential (primary) hypertension: Secondary | ICD-10-CM

## 2021-12-25 LAB — BASIC METABOLIC PANEL
BUN/Creatinine Ratio: 20 (ref 9–23)
BUN: 21 mg/dL — ABNORMAL HIGH (ref 6–20)
CO2: 22 mmol/L (ref 20–29)
Calcium: 9.1 mg/dL (ref 8.7–10.2)
Chloride: 102 mmol/L (ref 96–106)
Creatinine, Ser: 1.03 mg/dL — ABNORMAL HIGH (ref 0.57–1.00)
Glucose: 108 mg/dL — ABNORMAL HIGH (ref 70–99)
Potassium: 4.2 mmol/L (ref 3.5–5.2)
Sodium: 137 mmol/L (ref 134–144)
eGFR: 71 mL/min/{1.73_m2} (ref >59)

## 2022-01-07 NOTE — Addendum Note (Signed)
Addended by: Malena Peer D on: 01/07/2022 07:10 AM ? ? Modules accepted: Orders ? ?

## 2022-01-08 ENCOUNTER — Other Ambulatory Visit: Payer: BC Managed Care – PPO | Admitting: *Deleted

## 2022-01-08 ENCOUNTER — Other Ambulatory Visit: Payer: Self-pay

## 2022-01-08 DIAGNOSIS — I10 Essential (primary) hypertension: Secondary | ICD-10-CM | POA: Diagnosis not present

## 2022-01-09 LAB — BASIC METABOLIC PANEL
BUN/Creatinine Ratio: 15 (ref 9–23)
BUN: 16 mg/dL (ref 6–20)
CO2: 21 mmol/L (ref 20–29)
Calcium: 9.3 mg/dL (ref 8.7–10.2)
Chloride: 99 mmol/L (ref 96–106)
Creatinine, Ser: 1.05 mg/dL — ABNORMAL HIGH (ref 0.57–1.00)
Glucose: 105 mg/dL — ABNORMAL HIGH (ref 70–99)
Potassium: 4.8 mmol/L (ref 3.5–5.2)
Sodium: 135 mmol/L (ref 134–144)
eGFR: 69 mL/min/{1.73_m2} (ref >59)

## 2022-01-10 NOTE — Progress Notes (Signed)
Cardiology Office Note:    Date:  [id number]   ID:  Kathy [name], DOB [id number], MRN [id number]  PCP:  Camillia Herter, NP   [facility] Providers Cardiologist:  None     Referring MD: No ref. provider found  CC: HTN Eval  History of Present Illness:    Kathy [name] is a 41 y.o. female with a hx of juvenille HTN, Morbid Obesity, and OSA hwo presents for evaluation 08/19/21.  We did not receive prior records.  Has seen Pharm D.  Patient notes that she is doing better.   Has new arm and leg tingling that she attributes to her taking the aldactone at higher than recommended doses. There are no interval hospital/ED visit.    No chest pain or pressure .  No SOB/DOE and no PND/Orthopnea.  No weight gain or leg swelling.  No palpitations or syncope .   Past Medical History:  Diagnosis Date   Chest pain 2010   hospitalization and eval   Environmental allergies    Hypertension    age 78 yo   Obesity    Wears glasses     Past Surgical History:  Procedure Laterality Date   NO PAST SURGERIES  06/2015    Current Medications: Current Meds  Medication Sig   Ascorbic Acid (VITA-C PO) Take 1 tablet by mouth daily.    carvedilol (COREG) 25 MG tablet TAKE 1 TABLET(25 MG) BY MOUTH TWICE DAILY WITH A MEAL   cetirizine (ZYRTEC) 10 MG tablet Take 1 tablet (10 mg total) by mouth daily.   ferrous sulfate 325 (65 FE) MG tablet Take 325 mg by mouth daily with breakfast.   Flaxseed, Linseed, (FLAXSEED OIL PO) Take 1 capsule by mouth daily.   hydrALAZINE (APRESOLINE) 50 MG tablet Take 1.5 tablets (75 mg total) by mouth 3 (three) times daily.   Insulin Pen Needle (PEN NEEDLES) 32G X 6 MM MISC 1 pen by Does not apply route daily.   Multiple Vitamin (MULTIVITAMIN WITH MINERALS) TABS tablet Take 1 tablet by mouth daily.   spironolactone (ALDACTONE) 100 MG tablet Take 1 tablet (100 mg total) by mouth daily.   valsartan-hydrochlorothiazide (DIOVAN-HCT) 320-25 MG tablet Take 1 tablet by mouth  daily.   [DISCONTINUED] hydrALAZINE (APRESOLINE) 50 MG tablet Take 1 tablet (50 mg total) by mouth 3 (three) times daily.     Allergies:   Ibuprofen, Kiwi extract, and Shrimp [shellfish allergy]   Social History   Socioeconomic History   Marital status: Single    Spouse name: Not on file   Number of children: Not on file   Years of education: Not on file   Highest education level: Not on file  Occupational History   Not on file  Tobacco Use   Smoking status: Never   Smokeless tobacco: Never  Vaping Use   Vaping Use: Never used  Substance and Sexual Activity   Alcohol use: No   Drug use: No   Sexual activity: Never  Other Topics Concern   Not on file  Social History Narrative   Lives with mother.   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  Exercise - goes to [employer], treadmill and weights, goes 2-3 times per week.   Diet - consistent with 3 meals, but sometimes could be better.  No significant other. No children.    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 Father and Grandparents had refractory HTN  ROS:   Please see the history of present illness.     All other systems reviewed and are negative.  EKGs/Labs/Other Studies Reviewed:    The following studies were reviewed today:  EKG:   08/19/21: LVE with Secondary  repolarization   Transthoracic Echocardiogram: Date: 08/12/2008 Results: Per report LEFT VENTRICLE:   -  Left ventricular size was normal.   -  Overall left ventricular systolic function was normal.   -  Left ventricular ejection fraction was estimated , range being 50         % to 55 %.   -  There were no left ventricular regional wall motion         abnormalities.   -  Left ventricular wall thickness was increased in a pattern of         mild concentric hypertrophy.     Recent Labs: 03/09/2021: ALT 14 09/14/2021: Magnesium 2.0 01/08/2022: BUN 16; Creatinine, Ser 1.05; Potassium 4.8; Sodium 135  Recent Lipid Panel    Component Value Date/Time   CHOL 174 10/30/2021 1509   TRIG 162 (H) 10/30/2021 1509   HDL 43 10/30/2021 1509   CHOLHDL 4.0 10/30/2021 1509   CHOLHDL 3.1 06/10/2015 0940   VLDL 13 06/10/2015 0940   LDLCALC 103 (H) 10/30/2021 1509    Physical Exam:    VS:  BP 140/84    Pulse 77    Ht 5\' 3"  (1.6 m)    Wt (!) 355 lb (161 kg)    SpO2 98%    BMI 62.89 kg/m     Wt Readings from Last 3 Encounters:  01/11/22 (!) 355 lb (161 kg)  08/19/21 (!) 346 lb (156.9 kg)  05/29/21 (!) 330 lb 6.4 oz (149.9 kg)     Gen: no distress, Morbid obesity   Neck: No JVD Cardiac: No Rubs or Gallops, no Murmur, RRR +2 radial pulses Respiratory: Clear to auscultation bilaterally, normal effort, normal  respiratory rate GI: Soft, nontender, non-distended  MS: No  edema;  moves all extremities Integument: Skin feels warm Neuro:  At time of evaluation, alert and oriented to person/place/time/situation  Psych: Normal affect, patient feels ok   ASSESSMENT:    1. Accelerated hypertension   2. Morbid obesity ([place])   3. OSA (obstructive sleep apnea)     PLAN:    Refractory HTN Morbid Obesity OSA - continue Valsatran HCTZ 320 and 25, aldactone 100 mg Po daily, and Coreg 25 -Did not receive prior records - starting hydralazine 75 mg PO BID - BP has much improved - we offered  renal artery duplex and repeat OSA testing; deferred at this time time - patient is transitioning to new insurance in 3 months - clonidine (drowsiness) amlodipine (swelling)  Medication Adjustments/Labs and Tests Ordered: Current medicines are reviewed at length with the patient today.  Concerns regarding medicines are outlined above.  No orders of the defined types were placed in this encounter.  Meds ordered this encounter  Medications   hydrALAZINE (APRESOLINE) 50 MG tablet    Sig: Take 1.5 tablets (75 mg total) by mouth 3 (three) times daily.    Dispense:  405 tablet    Refill:  3    Please cut pills in half    Patient Instructions  Medication Instructions:  Your physician has recommended you make the following change in your medication:  INCREASE: hydralazine to 75 mg by mouth three times daily  *If you need a refill on your cardiac medications before your next appointment, please call your pharmacy*   Lab Work: NONE If you have labs (blood work) drawn today and your tests are completely normal, you will receive your results only by: [place] (if you have MyChart) OR A paper copy in the mail If you have any lab test that is abnormal or we need to change your treatment, we will call you to review the results.   Testing/Procedures: NONE   Follow-Up: At [facility], you and your health needs are our priority.  As part of our continuing mission to provide you with exceptional heart care, we have created designated Provider Care Teams.  These Care Teams include your primary Cardiologist (physician) and Advanced Practice Providers (APPs -  Physician Assistants and Nurse Practitioners) who all work together to provide you with the care you need, when you need it.  We recommend signing up for the patient portal called "MyChart".  Sign up information is provided on this After Visit Summary.  MyChart is used to connect with patients for Virtual Visits (Telemedicine).   Patients are able to view lab/test results, encounter notes, upcoming appointments, etc.  Non-urgent messages can be sent to your provider as well.   To learn more about what you can do with MyChart, go to [email or web address].    Your next appointment:   4 -5 month(s)  The format for your next appointment:   In Person  Provider:   Rudean Haskell, MD    :1}    Signed, Werner Lean, MD  01/11/2022 4:29 PM    [place]

## 2022-01-11 ENCOUNTER — Encounter: Payer: Self-pay | Admitting: Internal Medicine

## 2022-01-11 ENCOUNTER — Other Ambulatory Visit: Payer: Self-pay

## 2022-01-11 ENCOUNTER — Ambulatory Visit: Payer: BC Managed Care – PPO | Admitting: Internal Medicine

## 2022-01-11 VITALS — BP 140/84 | HR 77 | Ht 63.0 in | Wt 355.0 lb

## 2022-01-11 DIAGNOSIS — I10 Essential (primary) hypertension: Secondary | ICD-10-CM

## 2022-01-11 DIAGNOSIS — G4733 Obstructive sleep apnea (adult) (pediatric): Secondary | ICD-10-CM

## 2022-01-11 MED ORDER — HYDRALAZINE HCL 50 MG PO TABS: 75.0000 mg | ORAL_TABLET | Freq: Three times a day (TID) | ORAL | 3 refills | Status: DC

## 2022-01-11 NOTE — Patient Instructions (Signed)
Medication Instructions:  ?Your physician has recommended you make the following change in your medication:  ?INCREASE: hydralazine to 75 mg by mouth three times daily ? ?*If you need a refill on your cardiac medications before your next appointment, please call your pharmacy* ? ? ?Lab Work: ?NONE ?If you have labs (blood work) drawn today and your tests are completely normal, you will receive your results only by: ?MyChart Message (if you have MyChart) OR ?A paper copy in the mail ?If you have any lab test that is abnormal or we need to change your treatment, we will call you to review the results. ? ? ?Testing/Procedures: ?NONE ? ? ?Follow-Up: ?At [facility], you and your health needs are our priority.  As part of our continuing mission to provide you with exceptional heart care, we have created designated Provider Care Teams.  These Care Teams include your primary Cardiologist (physician) and Advanced Practice Providers (APPs -  Physician Assistants and Nurse Practitioners) who all work together to provide you with the care you need, when you need it. ? ?We recommend signing up for the patient portal called "MyChart".  Sign up information is provided on this After Visit Summary.  MyChart is used to connect with patients for Virtual Visits (Telemedicine).  Patients are able to view lab/test results, encounter notes, upcoming appointments, etc.  Non-urgent messages can be sent to your provider as well.   ?To learn more about what you can do with MyChart, go to [email or web address].   ? ?Your next appointment:   ?4 -5 month(s) ? ?The format for your next appointment:   ?In Person ? ?Provider:   ?Riley Lam, MD ? ?  :1}  ?

## 2022-01-28 ENCOUNTER — Encounter: Payer: Self-pay | Admitting: Internal Medicine

## 2022-01-28 ENCOUNTER — Encounter: Payer: Self-pay | Admitting: Family

## 2022-02-17 ENCOUNTER — Emergency Department (HOSPITAL_BASED_OUTPATIENT_CLINIC_OR_DEPARTMENT_OTHER)
Admission: EM | Admit: 2022-02-17 | Discharge: 2022-02-17 | Disposition: A | Discharge status: Home / Self Care | Payer: Self-pay | Attending: Emergency Medicine | Admitting: Emergency Medicine

## 2022-02-17 ENCOUNTER — Other Ambulatory Visit: Payer: Self-pay

## 2022-02-17 ENCOUNTER — Encounter (HOSPITAL_BASED_OUTPATIENT_CLINIC_OR_DEPARTMENT_OTHER): Payer: Self-pay | Admitting: Emergency Medicine

## 2022-02-17 ENCOUNTER — Emergency Department (HOSPITAL_BASED_OUTPATIENT_CLINIC_OR_DEPARTMENT_OTHER): Payer: Self-pay | Admitting: Radiology

## 2022-02-17 DIAGNOSIS — M545 Low back pain, unspecified: Secondary | ICD-10-CM | POA: Diagnosis not present

## 2022-02-17 DIAGNOSIS — R109 Unspecified abdominal pain: Secondary | ICD-10-CM | POA: Insufficient documentation

## 2022-02-17 DIAGNOSIS — G8929 Other chronic pain: Secondary | ICD-10-CM | POA: Insufficient documentation

## 2022-02-17 DIAGNOSIS — Z79899 Other long term (current) drug therapy: Secondary | ICD-10-CM | POA: Insufficient documentation

## 2022-02-17 DIAGNOSIS — Z794 Long term (current) use of insulin: Secondary | ICD-10-CM | POA: Insufficient documentation

## 2022-02-17 DIAGNOSIS — I10 Essential (primary) hypertension: Secondary | ICD-10-CM | POA: Insufficient documentation

## 2022-02-17 LAB — PREGNANCY, URINE: Preg Test, Ur: NEGATIVE

## 2022-02-17 LAB — URINALYSIS, ROUTINE W REFLEX MICROSCOPIC
Bilirubin Urine: NEGATIVE
Glucose, UA: NEGATIVE mg/dL
Hgb urine dipstick: NEGATIVE
Ketones, ur: NEGATIVE mg/dL
Nitrite: NEGATIVE
Protein, ur: NEGATIVE mg/dL
Specific Gravity, Urine: 1.025 (ref 1.005–1.030)
pH: 5.5 (ref 5.0–8.0)

## 2022-02-17 MED ORDER — METHOCARBAMOL 500 MG PO TABS
750.0000 mg | ORAL_TABLET | Freq: Once | ORAL | Status: DC
  Filled 2022-02-17: qty 2

## 2022-02-17 MED ORDER — CARVEDILOL 25 MG PO TABS: ORAL_TABLET | ORAL | 3 refills | Status: DC

## 2022-02-17 MED ORDER — VALSARTAN-HYDROCHLOROTHIAZIDE 320-25 MG PO TABS: 1.0000 | ORAL_TABLET | Freq: Every day | ORAL | 3 refills | Status: DC

## 2022-02-17 MED ORDER — HYDRALAZINE HCL 50 MG PO TABS: 75.0000 mg | ORAL_TABLET | Freq: Three times a day (TID) | ORAL | 3 refills | Status: DC

## 2022-02-17 MED ORDER — METHOCARBAMOL 750 MG PO TABS: 750.0000 mg | ORAL_TABLET | Freq: Three times a day (TID) | ORAL | 0 refills | Status: DC | PRN

## 2022-02-17 MED ORDER — SPIRONOLACTONE 100 MG PO TABS: 100.0000 mg | ORAL_TABLET | Freq: Every day | ORAL | 3 refills | Status: DC

## 2022-02-17 NOTE — Discharge Instructions (Addendum)
It was our pleasure to provide your ER care today - we hope that you feel better. ? ?Avoid bending at waist or heavy lifting > 20 lbs for the next week. ? ?Take acetaminophen as need for pain. You may also take robaxin as need for muscle pain/spasm - no driving when taking.  ? ?Follow up with primary care doctor in 1-2 weeks. Also have blood pressure rechecked then as it is mildly high today. ? ?Return to ER if worse, new symptoms, fevers, intractable pain, numbness/weakness, or other concern.  ?

## 2022-02-17 NOTE — ED Triage Notes (Signed)
About 2 months ago pt was instructed to take spironalactone 150 tid. After taking for 2 months her md noted the mistake , checked her cr and it was high. Pt has had cr rechecked and was normal. Loft kidney side has been hurting for about 1 month,but really feels worse now and urine is foul smelling.  ?

## 2022-02-17 NOTE — Telephone Encounter (Signed)
Pt's medications were sent to pt's pharmacy as requested. Confirmation received.  

## 2022-02-17 NOTE — ED Provider Notes (Signed)
?[facility] EMERGENCY DEPT ?Provider Note ? ? ?CSN: [id number] ?Arrival date & time: 02/17/22  1541 ? ?  ? ?History ? ?Chief Complaint  ?Patient presents with  ? Flank Pain  ? ? ?BRAEDYN WEISENBACH is a 41 y.o. female. ? ?Pt c/o low back pain for past 2-3 months. Symptoms gradual onset, moderate, persistent, dull, non radiating, worse w certain positions/positional changes. No radicular pain or leg pain. No leg or saddle area numbness. No weakness. No problems w normal bowel or bladder fxn. No recent injury. No fever or chills. No dysuria or hematuria. No hx kidney stones. Pain is bilateral, lower back. No anterior pain, no abd or pelvic pain. Not sexually activity, no vaginal discharge or bleeding.  ? ?The history is provided by the patient and a relative.  ?Flank Pain ?Pertinent negatives include no chest pain, no abdominal pain and no shortness of breath.  ? ?  ? ?Home Medications ?Prior to Admission medications   ?Medication Sig Start Date End Date Taking? Authorizing Provider  ?Ascorbic Acid (VITA-C PO) Take 1 tablet by mouth daily.     [provider]  ?carvedilol (COREG) 25 MG tablet TAKE 1 TABLET(25 MG) BY MOUTH TWICE DAILY WITH A MEAL 02/17/22   Chandrasekhar, Mahesh A, MD  ?cetirizine (ZYRTEC) 10 MG tablet Take 1 tablet (10 mg total) by mouth daily. 10/07/21   Fenton Foy, NP  ?ferrous sulfate 325 (65 FE) MG tablet Take 325 mg by mouth daily with breakfast.    [provider]  ?Flaxseed, Linseed, (FLAXSEED OIL PO) Take 1 capsule by mouth daily.    [provider]  ?hydrALAZINE (APRESOLINE) 50 MG tablet Take 1.5 tablets (75 mg total) by mouth 3 (three) times daily. 02/17/22   Werner Lean, MD  ?Insulin Pen Needle (PEN NEEDLES) 32G X 6 MM MISC 1 pen by Does not apply route daily. 10/28/21   Werner Lean, MD  ?Multiple Vitamin (MULTIVITAMIN WITH MINERALS) TABS tablet Take 1 tablet by mouth daily.    [provider]  ?spironolactone (ALDACTONE)  100 MG tablet Take 1 tablet (100 mg total) by mouth daily. 02/17/22   Werner Lean, MD  ?valsartan-hydrochlorothiazide (DIOVAN-HCT) 320-25 MG tablet Take 1 tablet by mouth daily. 02/17/22   Werner Lean, MD  ?   ? ?Allergies    ?Ibuprofen, Kiwi extract, and Shrimp [shellfish allergy]   ? ?Review of Systems   ?Review of Systems  ?Constitutional:  Negative for chills and fever.  ?Eyes:  Negative for redness.  ?Respiratory:  Negative for shortness of breath.   ?Cardiovascular:  Negative for chest pain.  ?Gastrointestinal:  Negative for abdominal pain.  ?Genitourinary:  Negative for dysuria and hematuria.  ?Musculoskeletal:  Positive for back pain. Negative for neck pain.  ?Skin:  Negative for rash.  ?Neurological:  Negative for weakness and numbness.  ?Hematological:  Does not bruise/bleed easily.  ?Psychiatric/Behavioral:  Negative for confusion.   ? ?Physical Exam ?Updated Vital Signs ?BP (!) 159/80   Pulse 78   Temp 97.8 ?F (36.6 ?C) (Oral)   Resp 20   SpO2 100%  ?Physical Exam ?Vitals and nursing note reviewed.  ?Constitutional:   ?   Appearance: Normal appearance. She is well-developed.  ?HENT:  ?   Head: Atraumatic.  ?   Nose: Nose normal.  ?   Mouth/Throat:  ?   Mouth: Mucous membranes are moist.  ?Eyes:  ?   General: No scleral icterus. ?   Conjunctiva/sclera: Conjunctivae normal.  ?  Neck:  ?   Trachea: No tracheal deviation.  ?Cardiovascular:  ?   Rate and Rhythm: Normal rate and regular rhythm.  ?   Pulses: Normal pulses.  ?   Heart sounds: Normal heart sounds. No murmur heard. ?  No friction rub.  ?Pulmonary:  ?   Effort: Pulmonary effort is normal. No respiratory distress.  ?   Breath sounds: Normal breath sounds.  ?Abdominal:  ?   General: Bowel sounds are normal. There is no distension.  ?   Palpations: Abdomen is soft. There is no mass.  ?   Tenderness: There is no abdominal tenderness.  ?Genitourinary: ?   Comments: No cva tenderness.  ?Musculoskeletal:     ?   General: No  swelling.  ?   Cervical back: Normal range of motion and neck supple. No rigidity. No muscular tenderness.  ?   Comments: TLS spine non tender, aligned. No sts or skin lesions/changes in area of pain.   ?Skin: ?   General: Skin is warm and dry.  ?   Findings: No rash.  ?Neurological:  ?   Mental Status: She is alert.  ?   Comments: Alert, speech normal. Motor intact bil legs, stre 5/5. Sens grossly intact. Steady gait.   ?Psychiatric:     ?   Mood and Affect: Mood normal.  ? ? ?ED Results / Procedures / Treatments   ?Labs ?(all labs ordered are listed, but only abnormal results are displayed) ?Results for orders placed or performed during the hospital encounter of 02/17/22  ?Urinalysis, Routine w reflex microscopic Urine, Clean Catch  ?Result Value Ref Range  ? Color, Urine YELLOW YELLOW  ? APPearance CLEAR CLEAR  ? Specific Gravity, Urine 1.025 1.005 - 1.030  ? pH 5.5 5.0 - 8.0  ? Glucose, UA NEGATIVE NEGATIVE mg/dL  ? Hgb urine dipstick NEGATIVE NEGATIVE  ? Bilirubin Urine NEGATIVE NEGATIVE  ? Ketones, ur NEGATIVE NEGATIVE mg/dL  ? Protein, ur NEGATIVE NEGATIVE mg/dL  ? Nitrite NEGATIVE NEGATIVE  ? Leukocytes,Ua TRACE (A) NEGATIVE  ? RBC / HPF 0-5 0 - 5 RBC/hpf  ? WBC, UA 0-5 0 - 5 WBC/hpf  ? Bacteria, UA RARE (A) NONE SEEN  ? Squamous Epithelial / LPF 0-5 0 - 5  ? Mucus PRESENT   ?Pregnancy, urine  ?Result Value Ref Range  ? Preg Test, Ur NEGATIVE NEGATIVE  ? ? ?EKG ?None ? ?Radiology ?DG Lumbar Spine Complete ? ?Result Date: 02/17/2022 ?CLINICAL DATA:  Flank pain EXAM: LUMBAR SPINE - COMPLETE 4+ VIEW COMPARISON:  None. FINDINGS: Normal alignment of lumbar vertebral bodies. No loss of vertebral body height or disc height. No pars fracture. No subluxation. IMPRESSION: Normal lumbar radiographs. Electronically Signed   By: Suzy Bouchard M.D.   On: 02/17/2022 18:11   ? ?Procedures ?Procedures  ? ? ?Medications Ordered in ED ?Medications  ?methocarbamol (ROBAXIN) tablet 750 mg (has no administration in time range)   ? ? ?ED Course/ Medical Decision Making/ A&P ?  ?                        ?Medical Decision Making ?Problems Addressed: ?Acute bilateral low back pain without sciatica: acute illness or injury with systemic symptoms ?Chronic bilateral low back pain without sciatica: chronic illness or injury ?Essential hypertension: chronic illness or injury that poses a threat to life or bodily functions ? ?Amount and/or Complexity of Data Reviewed ?Independent Historian:  ?   Details: family, hx ?External  Data Reviewed: notes. ?Labs: ordered. Decision-making details documented in ED Course. ?Radiology: ordered and independent interpretation performed. Decision-making details documented in ED Course. ? ?Risk ?OTC drugs. ?Prescription drug management. ? ? ?Labs sent. Imaging ordered. ? ?Reviewed nursing notes and prior charts for additional history.  ? ?Labs reviewed/interpreted by me - no uti.  ? ?No meds pta. Robaxin po. Pt has ride, does not have to drive.  ? ?Xrays reviewed/interpreted by me-  no fx.  ? ?Rec pcp f/u. ? ?Return precautions provided.  ? ? ? ? ? ? ? ? ? ?Final Clinical Impression(s) / ED Diagnoses ?Final diagnoses:  ?None  ? ? ?Rx / DC Orders ?ED Discharge Orders   ? ? None  ? ?  ? ? ?  ?Lajean Saver, MD ?02/17/22 1824 ? ?

## 2022-05-31 ENCOUNTER — Ambulatory Visit: Payer: Self-pay | Admitting: Family Medicine

## 2022-06-15 ENCOUNTER — Ambulatory Visit: Payer: BC Managed Care – PPO | Admitting: Internal Medicine

## 2022-06-21 ENCOUNTER — Encounter: Payer: Self-pay | Admitting: Family Medicine

## 2022-06-21 ENCOUNTER — Ambulatory Visit (INDEPENDENT_AMBULATORY_CARE_PROVIDER_SITE_OTHER): Payer: BC Managed Care – PPO | Admitting: Family Medicine

## 2022-06-21 VITALS — BP 130/88 | HR 80 | Temp 97.9°F | Ht 63.0 in | Wt 358.7 lb

## 2022-06-21 DIAGNOSIS — J301 Allergic rhinitis due to pollen: Secondary | ICD-10-CM | POA: Diagnosis not present

## 2022-06-21 DIAGNOSIS — I10 Essential (primary) hypertension: Secondary | ICD-10-CM

## 2022-06-21 DIAGNOSIS — R7303 Prediabetes: Secondary | ICD-10-CM

## 2022-06-21 DIAGNOSIS — H6591 Unspecified nonsuppurative otitis media, right ear: Secondary | ICD-10-CM | POA: Diagnosis not present

## 2022-06-21 LAB — TSH: TSH: 2.19 u[IU]/mL (ref 0.35–5.50)

## 2022-06-21 LAB — POCT GLYCOSYLATED HEMOGLOBIN (HGB A1C): Hemoglobin A1C: 5.4 % (ref 4.0–5.6)

## 2022-06-21 MED ORDER — CEFDINIR 300 MG PO CAPS: 300.0000 mg | ORAL_CAPSULE | Freq: Two times a day (BID) | ORAL | 0 refills | Status: AC

## 2022-06-21 MED ORDER — MONTELUKAST SODIUM 10 MG PO TABS: 10.0000 mg | ORAL_TABLET | Freq: Every day | ORAL | 3 refills | Status: DC

## 2022-06-21 NOTE — Patient Instructions (Addendum)
Call your insurance company, ask if they cover Wegovy or Saxenda for weight loss treatment.  Goal protein intake: 100 grams per day  Goal fiber: 25 grams per day   Goal calorie intake: 2000 calories per day  Focus on eating non starchy vegetables, a increase nuts and eggs in your diet to increase your protein intake.

## 2022-06-21 NOTE — Assessment & Plan Note (Signed)
BP is under 140 today which is at goal. She is currently on: Current hypertension medications:      Sig   carvedilol (COREG) 25 MG tablet (Taking) TAKE 1 TABLET(25 MG) BY MOUTH TWICE DAILY WITH A MEAL   hydrALAZINE (APRESOLINE) 50 MG tablet (Taking) Take 1.5 tablets (75 mg total) by mouth 3 (three) times daily.   spironolactone (ALDACTONE) 100 MG tablet (Taking) Take 1 tablet (100 mg total) by mouth daily.   valsartan-hydrochlorothiazide (DIOVAN-HCT) 320-25 MG tablet (Taking) Take 1 tablet by mouth daily.    These are working well for her, will continue this regimen. I also reviewed the labs she had done previously, has not had her thyroid checked recently, will order this for her today.

## 2022-06-21 NOTE — Progress Notes (Signed)
New Patient Office Visit  Subjective    Patient ID: Kathy [name], female    DOB: 09/26/1981  Age: 41 y.o. MRN: [id number]  CC:  Chief Complaint  Patient presents with   Establish Care   Ear Fullness    Patient complains of "the sensation that the right ear is under water and she can hear her heartbeat" x2 weeks, denies pain    HPI Kathy [name] presents to establish care Patient is reporting right ear pressure/ feeling like she is underwater, states she has had this before, had to be treated with antibiotics. No fever/chills, states that she has been having a lot of mucus production recently. Has been taking her flonase and zyrtec daily, still having mucus production despite taking these medications.   HTN-- pt follows with a cardiologist regularly for her HTN. States that she is checking her BP at home regularly, is getting normal readings. She reports compliance with her medications, she denies any current side effects to her medications.  Obesity/ Prediabetes-- I reviewed the patient's past medical history, including laboratory work that was previously done by her cardiologist. She had an elevated A1C in dec 2022, this was repeated today in the office. Patient is also interested in weight loss counseling and medications. She reports she has tried meal replacement shakes in the past, states that during COVID her brother passed away and during her grieving she had difficulty controlling her diet. Has never tried medication to help with weight loss.   Outpatient Encounter Medications as of 06/21/2022  Medication Sig   carvedilol (COREG) 25 MG tablet TAKE 1 TABLET(25 MG) BY MOUTH TWICE DAILY WITH A MEAL   cefdinir (OMNICEF) 300 MG capsule Take 1 capsule (300 mg total) by mouth 2 (two) times daily for 7 days.   cetirizine (ZYRTEC) 10 MG tablet Take 1 tablet (10 mg total) by mouth daily.   ferrous sulfate 325 (65 FE) MG tablet Take 325 mg by mouth daily with breakfast.   fluticasone  (FLONASE) 50 MCG/ACT nasal spray Place into both nostrils daily.   hydrALAZINE (APRESOLINE) 50 MG tablet Take 1.5 tablets (75 mg total) by mouth 3 (three) times daily.   montelukast (SINGULAIR) 10 MG tablet Take 1 tablet (10 mg total) by mouth at bedtime.   spironolactone (ALDACTONE) 100 MG tablet Take 1 tablet (100 mg total) by mouth daily.   valsartan-hydrochlorothiazide (DIOVAN-HCT) 320-25 MG tablet Take 1 tablet by mouth daily.   [DISCONTINUED] Ascorbic Acid (VITA-C PO) Take 1 tablet by mouth daily.    [DISCONTINUED] Flaxseed, Linseed, (FLAXSEED OIL PO) Take 1 capsule by mouth daily.   [DISCONTINUED] Insulin Pen Needle (PEN NEEDLES) 32G X 6 MM MISC 1 pen by Does not apply route daily.   [DISCONTINUED] methocarbamol (ROBAXIN) 750 MG tablet Take 1 tablet (750 mg total) by mouth 3 (three) times daily as needed (muscle spasm/pain).   [DISCONTINUED] Multiple Vitamin (MULTIVITAMIN WITH MINERALS) TABS tablet Take 1 tablet by mouth daily.   No facility-administered encounter medications on file as of 06/21/2022.    Past Medical History:  Diagnosis Date   Chest pain 2010   hospitalization and eval   Environmental allergies    Hypertension    age 12 yo   Obesity    Wears glasses     Past Surgical History:  Procedure Laterality Date   NO PAST SURGERIES  06/2015    Family History  Problem Relation Age of Onset   Hypertension Mother    Diabetes Mother  Other Father        died of unknown causes   Hypertension Father    Asthma Sister    Heart disease Maternal Grandmother    Diabetes Maternal Grandmother    Hypertension Maternal Grandmother    Cancer Maternal Aunt        cervical   Diabetes Maternal Grandfather     Social History   Socioeconomic History   Marital status: Single    Spouse name: Not on file   Number of children: Not on file   Years of education: Not on file   Highest education level: Not on file  Occupational History   Not on file  Tobacco Use   Smoking  status: Former    Types: Cigarettes   Smokeless tobacco: Never   Tobacco comments:    Smoked as a teenager  Advertising account planner   Vaping Use: Never used  Substance and Sexual Activity   Alcohol use: No   Drug use: No   Sexual activity: Never  Other Topics Concern   Not on file  Social History Narrative   Lives with mother.   Works as Research scientist (medical) at [employer], and Public librarian at [employer].  Exercise - goes to [employer], treadmill and weights, goes 2-3 times per week.   Diet - consistent with 3 meals, but sometimes could be better.  No significant other. No children.    Social Determinants of Health   Financial Resource Strain: Not on file  Food Insecurity: Not on file  Transportation Needs: Not on file  Physical Activity: Not on file  Stress: Not on file  Social Connections: Not on file  Intimate Partner Violence: Not on file    Review of Systems  Constitutional:  Negative for chills, fever and malaise/fatigue.  HENT:  Positive for congestion. Negative for ear discharge, ear pain, hearing loss, sinus pain and tinnitus.   Eyes:  Negative for blurred vision.  Respiratory:  Negative for cough and shortness of breath.   All other systems reviewed and are negative.       Objective    BP 130/88 (BP Location: Left Arm, Patient Position: Sitting, Cuff Size: Large)   Pulse 80   Temp 97.9 F (36.6 C) (Oral)   Ht 5\' 3"  (1.6 m)   Wt (!) 358 lb 11.2 oz (162.7 kg)   LMP 12/02/2021 (Exact Date)   SpO2 96%   BMI 63.54 kg/m   Physical Exam Vitals reviewed.  Constitutional:      Appearance: Normal appearance. She is well-groomed. She is obese.  HENT:     Right Ear: A middle ear effusion is present.     Left Ear: Tympanic membrane and ear canal normal.  Eyes:     Extraocular Movements: Extraocular movements intact.     Conjunctiva/sclera: Conjunctivae normal.     Pupils: Pupils are equal, round, and reactive to light.  Cardiovascular:     Rate and Rhythm:  Normal rate and regular rhythm.     Heart sounds: S1 normal and S2 normal.  Pulmonary:     Effort: Pulmonary effort is normal.     Breath sounds: Normal breath sounds and air entry.  Abdominal:     General: Bowel sounds are normal.     Palpations: Abdomen is soft.  Musculoskeletal:        General: Normal range of motion.     Cervical back: Normal range of motion and neck supple.     Right lower leg: No  edema.     Left lower leg: No edema.  Skin:    General: Skin is warm and dry.  Neurological:     Mental Status: She is alert and oriented to person, place, and time. Mental status is at baseline.     Gait: Gait is intact.  Psychiatric:        Mood and Affect: Mood and affect normal.        Speech: Speech normal.        Behavior: Behavior normal.        Judgment: Judgment normal.         Assessment & Plan:   Problem List Items Addressed This Visit       Cardiovascular and Mediastinum   Hypertension, uncontrolled    BP is under 140 today which is at goal. She is currently on: Current hypertension medications:       Sig   carvedilol (COREG) 25 MG tablet (Taking) TAKE 1 TABLET(25 MG) BY MOUTH TWICE DAILY WITH A MEAL   hydrALAZINE (APRESOLINE) 50 MG tablet (Taking) Take 1.5 tablets (75 mg total) by mouth 3 (three) times daily.   spironolactone (ALDACTONE) 100 MG tablet (Taking) Take 1 tablet (100 mg total) by mouth daily.   valsartan-hydrochlorothiazide (DIOVAN-HCT) 320-25 MG tablet (Taking) Take 1 tablet by mouth daily.     These are working well for her, will continue this regimen. I also reviewed the labs she had done previously, has not had her thyroid checked recently, will order this for her today.      Relevant Orders   TSH     Other   Morbid obesity (HCC)    I have had an extensive (>30 minute)  Conversation today with the patient about healthy eating habits, exercise, calorie and carb goals for sustainable and successful weight loss. I gave the patient caloric  and protein daily intake values as well as described the importance of increasing fiber and water intake. I advised she call her insurance company to see if the weight loss medications are covered under her plan. If so then we can start either [facility] or Saxenda. If not, then there are other medications approved for weight loss we can consider (phentermine and/or Contrave). We had a long conversation about risks/benefits associated with the medications for weight loss. I checked her A1C today and it is normal today. Will see her back in 3 months for a well woman/pap and we will use that visit to track her progress also.      Other Visit Diagnoses     Middle ear effusion, right    -  Primary - patient had bulging of the right TM on exam, no redness however her symptoms are persistent and she is being treated already with flonase and antihistamines daily. Will try a course of abx to see if this improves her symptoms. I advised she try to insufflate her ears several times a day to facilitate drainage of the effusion.   Relevant Medications   cefdinir (OMNICEF) 300 MG capsule   Non-seasonal allergic rhinitis due to pollen       Relevant Medications   montelukast (SINGULAIR) 10 MG tablet - patient is on flonase and zyrtec once daily and is still having symptoms, will add singulair 10 mg daily to her regimen.   Prediabetes       Relevant Orders   POC HgB A1c (Completed)  History of, A1C today is in the normal range. She will continue her dietary efforts. See  discussion above.     Return in about 3 months (around 09/21/2022) for for pap smear and pelvic.   Karie Georges, MD

## 2022-06-21 NOTE — Progress Notes (Signed)
Normal TSH - no thyroid disease

## 2022-06-21 NOTE — Assessment & Plan Note (Signed)
I have had an extensive (>30 minute)  Conversation today with the patient about healthy eating habits, exercise, calorie and carb goals for sustainable and successful weight loss. I gave the patient caloric and protein daily intake values as well as described the importance of increasing fiber and water intake. I advised she call her insurance company to see if the weight loss medications are covered under her plan. If so then we can start either [facility] or Saxenda. If not, then there are other medications approved for weight loss we can consider (phentermine and/or Contrave). We had a long conversation about risks/benefits associated with the medications for weight loss. I checked her A1C today and it is normal today. Will see her back in 3 months for a well woman/pap and we will use that visit to track her progress also.

## 2022-07-07 ENCOUNTER — Encounter: Payer: Self-pay | Admitting: Family Medicine

## 2022-07-09 ENCOUNTER — Telehealth: Payer: Self-pay | Admitting: *Deleted

## 2022-07-09 MED ORDER — NALTREXONE-BUPROPION HCL ER 8-90 MG PO TB12: ORAL_TABLET | ORAL | 5 refills | Status: DC

## 2022-07-09 NOTE — Telephone Encounter (Signed)
[facility] faxed a note stating Contrave 8-90mg  ER tablets is not covered by insurance and covered formulary alternatives were listed.  Info given to PCP for review.

## 2022-07-09 NOTE — Telephone Encounter (Signed)
It looks like they will cover Saxenda injections, which is similar to Ozempic for weight loss. Would she like me to call this in for her?

## 2022-07-09 NOTE — Telephone Encounter (Signed)
Left a detailed message with the information below at the patient's cell number and requested she call back with her preference. 

## 2022-07-12 NOTE — Telephone Encounter (Signed)
See My chart message

## 2022-08-15 NOTE — Progress Notes (Unsigned)
Cardiology Office Note:    Date:  24/40/1027   ID:  Rudie, Rikard February 15, 1981, MRN [id number]  PCP:  Farrel Conners, MD   [facility] Providers Cardiologist:  Werner Lean, MD     Referring MD: Camillia Herter, NP   CC: HTN Follow up   History of Present Illness:    Kathy [name] is a 41 y.o. female with a hx of juvenille HTN, Morbid Obesity, and OSA hwo presents for evaluation 08/19/21.  We did not receive prior records.  Has seen Pharm D. 2023:  Deferred secondary HTN work up.  Changed insurance  Patient notes that she is doing well.   Since last visit notes that she is working and going to church . There are no interval hospital/ED visit.    No chest pain or pressure .  No SOB/DOE and no PND/Orthopnea.  No weight gain or leg swelling.  No palpitations or syncope. Has no issues with sleep save for needing more time to sleep.   Past Medical History:  Diagnosis Date   Chest pain 2010   hospitalization and eval   Environmental allergies    Hypertension    age 33 yo   Obesity    Wears glasses     Past Surgical History:  Procedure Laterality Date   NO PAST SURGERIES  06/2015    Current Medications: Current Meds  Medication Sig   carvedilol (COREG) 25 MG tablet TAKE 1 TABLET(25 MG) BY MOUTH TWICE DAILY WITH A MEAL   cetirizine (ZYRTEC) 10 MG tablet Take 1 tablet (10 mg total) by mouth daily.   ferrous sulfate 325 (65 FE) MG tablet Take 325 mg by mouth daily with breakfast.   fluticasone (FLONASE) 50 MCG/ACT nasal spray Place into both nostrils daily.   hydrALAZINE (APRESOLINE) 50 MG tablet Take 1.5 tablets (75 mg total) by mouth 3 (three) times daily.   montelukast (SINGULAIR) 10 MG tablet Take 1 tablet (10 mg total) by mouth at bedtime.   Naltrexone-buPROPion HCl ER 8-90 MG TB12 Start 1 tablet every morning for 7 days, then 1 tablet twice daily for 7 days, then 2 tablets every morning and one in the evening, then 2 tablets twice a day    spironolactone (ALDACTONE) 100 MG tablet Take 1 tablet (100 mg total) by mouth daily.   valsartan-hydrochlorothiazide (DIOVAN-HCT) 320-25 MG tablet Take 1 tablet by mouth daily.     Allergies:   Ibuprofen, Kiwi extract, and Shrimp [shellfish allergy]   Social History   Socioeconomic History   Marital status: Single    Spouse name: Not on file   Number of children: Not on file   Years of education: Not on file   Highest education level: Not on file  Occupational History   Not on file  Tobacco Use   Smoking status: Former    Types: Cigarettes   Smokeless tobacco: Never   Tobacco comments:    Smoked as a teenager  Media planner   Vaping Use: Never used  Substance and Sexual Activity   Alcohol use: No   Drug use: No   Sexual activity: Never  Other Topics Concern   Not on file  Social History Narrative   Lives with mother.   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  Exercise - goes to [employer], treadmill and weights, goes 2-3 times per week.   Diet - consistent with 3 meals, but sometimes could be better.  No significant other. No children.    Social Determinants of Health   Financial Resource Strain: Not on file  Food Insecurity: Not on file  Transportation Needs: Not on file  Physical Activity: Not on file  Stress: Not on file  Social Connections: Not on file    Social: moved from [place] ([place]), 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 Father and Grandparents had refractory HTN  ROS:   Please see the history of present illness.     All other systems reviewed  and are negative.  EKGs/Labs/Other Studies Reviewed:    The following studies were reviewed today:  EKG:   08/16/22: SR with LVH and secondary repol 08/19/21: LVE with Secondary repolarization  Transthoracic Echocardiogram: Date: 08/12/2008 Results: Per report LEFT VENTRICLE:   -  Left ventricular size was normal.   -  Overall left ventricular systolic function was normal.   -  Left ventricular ejection fraction was estimated , range being 50         % to 55 %.   -  There were no left ventricular regional wall motion         abnormalities.   -  Left ventricular wall thickness was increased in a pattern of         mild concentric hypertrophy.     Recent Labs: 09/14/2021: Magnesium 2.0 01/08/2022: BUN 16; Creatinine, Ser 1.05; Potassium 4.8; Sodium 135 06/21/2022: TSH 2.19  Recent Lipid Panel    Component Value Date/Time   CHOL 174 10/30/2021 1509   TRIG 162 (H) 10/30/2021 1509   HDL 43 10/30/2021 1509   CHOLHDL 4.0 10/30/2021 1509   CHOLHDL 3.1 06/10/2015 0940   VLDL 13 06/10/2015 0940   LDLCALC 103 (H) 10/30/2021 1509    Physical Exam:    VS:  BP 128/78   Pulse 82   Ht 5\' 5"  (1.651 m)   Wt (!) 358 lb (162.4 kg)   SpO2 97%   BMI 59.57 kg/m     Wt Readings from Last 3 Encounters:  08/16/22 (!) 358 lb (162.4 kg)  06/21/22 (!) 358 lb 11.2 oz (162.7 kg)  01/11/22 (!) 355 lb (161 kg)    Gen: no distress, Morbid obesity   Neck: No JVD Cardiac: No Rubs or Gallops, no murmur, RRR +2 radial pulses Respiratory: Clear to auscultation bilaterally, normal effort, normal  respiratory rate GI: Soft, nontender, non-distended  MS: No  edema;  moves all extremities Integument: Skin feels warm Neuro:  At time of evaluation, alert and oriented to person/place/time/situation  Psych: Normal affect, patient feels ok  ASSESSMENT:    1. OSA (obstructive sleep apnea)   2. Morbid obesity (HCC)   3. Essential hypertension     PLAN:    Refractory HTN Morbid Obesity OSA -  continue Valsatran- HCTZ 320 and 25, aldactone 100 mg PO daily, and Coreg 25 - on hydralazine 75 mg PO BID - at home BP is ok - we offered renal artery duplex and repeat OSA testing; deferred again at this time time; if she has  - clonidine (drowsiness) amlodipine (swelling) - we have discussed diet changes related to simple sugar consumption; we have discussed Contrave  and its indications for weight loss.  She has this medication prescribed from her PCP  One year with me   Medication Adjustments/Labs and Tests Ordered: Current medicines are reviewed at length with the patient today.  Concerns regarding medicines are outlined above.  Orders Placed This Encounter  Procedures   EKG 12-Lead   No orders of the defined types were placed in this encounter.   Patient Instructions  Medication Instructions:  Your physician recommends that you continue on your current medications as directed. Please refer to the Current Medication list given to you today.  *If you need a refill on your cardiac medications before your next appointment, please call your pharmacy*   Lab Work: NONE If you have labs (blood work) drawn today and your tests are completely normal, you will receive your results only by: MyChart Message (if you have MyChart) OR A paper copy in the mail If you have any lab test that is abnormal or we need to change your treatment, we will call you to review the results.   Testing/Procedures: NONE   Follow-Up: At [facility], you and your health needs are our priority.  As part of our continuing mission to provide you with exceptional heart care, we have created designated Provider Care Teams.  These Care Teams include your primary Cardiologist (physician) and Advanced Practice Providers (APPs -  Physician Assistants and Nurse Practitioners) who all work together to provide you with the care you need, when you need it.    Your next appointment:   1 year(s)  The format  for your next appointment:   In Person  Provider:   Christell Constant, MD     Important Information About Sugar         Signed, Christell Constant, MD  08/16/2022 11:05 AM    [facility]

## 2022-08-16 ENCOUNTER — Ambulatory Visit: Payer: BC Managed Care – PPO | Attending: Internal Medicine | Admitting: Internal Medicine

## 2022-08-16 ENCOUNTER — Encounter: Payer: Self-pay | Admitting: Internal Medicine

## 2022-08-16 VITALS — BP 128/78 | HR 82 | Ht 65.0 in | Wt 358.0 lb

## 2022-08-16 DIAGNOSIS — I10 Essential (primary) hypertension: Secondary | ICD-10-CM | POA: Diagnosis not present

## 2022-08-16 DIAGNOSIS — G4733 Obstructive sleep apnea (adult) (pediatric): Secondary | ICD-10-CM

## 2022-08-16 NOTE — Patient Instructions (Signed)
Medication Instructions:  Your physician recommends that you continue on your current medications as directed. Please refer to the Current Medication list given to you today.  *If you need a refill on your cardiac medications before your next appointment, please call your pharmacy*   Lab Work: NONE  If you have labs (blood work) drawn today and your tests are completely normal, you will receive your results only by: MyChart Message (if you have MyChart) OR A paper copy in the mail If you have any lab test that is abnormal or we need to change your treatment, we will call you to review the results.   Testing/Procedures: NONE   Follow-Up: At [facility], you and your health needs are our priority.  As part of our continuing mission to provide you with exceptional heart care, we have created designated Provider Care Teams.  These Care Teams include your primary Cardiologist (physician) and Advanced Practice Providers (APPs -  Physician Assistants and Nurse Practitioners) who all work together to provide you with the care you need, when you need it.   Your next appointment:   1 year(s)  The format for your next appointment:   In Person  Provider:   Mahesh A Chandrasekhar, MD      Important Information About Sugar       

## 2022-09-20 ENCOUNTER — Encounter: Payer: Self-pay | Admitting: Family Medicine

## 2022-09-20 ENCOUNTER — Ambulatory Visit (INDEPENDENT_AMBULATORY_CARE_PROVIDER_SITE_OTHER): Payer: BC Managed Care – PPO | Admitting: Family Medicine

## 2022-09-20 ENCOUNTER — Other Ambulatory Visit (HOSPITAL_COMMUNITY)
Admission: RE | Admit: 2022-09-20 | Discharge: 2022-09-20 | Disposition: A | Discharge status: Home / Self Care | Payer: BC Managed Care – PPO | Source: Ambulatory Visit | Attending: Family Medicine | Admitting: Family Medicine

## 2022-09-20 VITALS — BP 132/82 | HR 85 | Temp 97.8°F | Ht 64.0 in | Wt 360.6 lb

## 2022-09-20 DIAGNOSIS — Z01419 Encounter for gynecological examination (general) (routine) without abnormal findings: Secondary | ICD-10-CM | POA: Diagnosis not present

## 2022-09-20 DIAGNOSIS — Z9109 Other allergy status, other than to drugs and biological substances: Secondary | ICD-10-CM | POA: Diagnosis not present

## 2022-09-20 DIAGNOSIS — Z1231 Encounter for screening mammogram for malignant neoplasm of breast: Secondary | ICD-10-CM

## 2022-09-20 MED ORDER — PHENTERMINE HCL 15 MG PO CAPS: 15.0000 mg | ORAL_CAPSULE | ORAL | 0 refills | Status: DC

## 2022-09-20 MED ORDER — AZELASTINE HCL 0.15 % NA SOLN: 1.0000 | Freq: Every day | NASAL | 2 refills | Status: AC

## 2022-09-20 NOTE — Assessment & Plan Note (Signed)
On singulair 10 mg daily, flonase spray daily, and zyrte 10 mg daily. Pt continues to have sinus pressure and symptoms, will try adding azelastine nasal spray to her regimen. If this does not improve her s xthen will refer her to the allergist.

## 2022-09-20 NOTE — Assessment & Plan Note (Signed)
Patient was unable to get the [facility] approved. I recommended that we start with phentermine 15 mg capsule daily for appetite suppression and she will call her insurance company to find out what the requirements are for wegovy treatment.

## 2022-09-20 NOTE — Progress Notes (Signed)
Complete physical exam  Patient: Kathy [name]   DOB: 07-Nov-1980   41 y.o. Female  MRN: [id number]  Subjective:    Chief Complaint  Patient presents with   Annual Exam    Kathy [name] is a 40 y.o. female who presents today for a complete physical exam. She reports consuming a general diet. Home exercise routine includes walking several times a week. She generally feels well. She reports sleeping well. She does have additional problems to discuss today.   Pt reports that she is still having a sinus pressure and congestion even on her zyrtec, singulair and flonase spray. States that there is constant production and dripping in her throat. We discussed switching her nasal spray to azelastine or using this in combination with the flonase and she is agreeable.  MO- pt reports she is using meal replacement in the morning only. Was unable to get Wegovy due to insurance lack of coverage. We discussed using phentermine for appetite suppression and she is agreeable.   Most recent fall risk assessment:    05/29/2021    2:32 PM  Fall Risk   Falls in the past year? 0  Number falls in past yr: 0  Injury with Fall? 0  Risk for fall due to : No Fall Risks  Follow up Falls evaluation completed     Most recent depression screenings:    06/21/2022   10:32 AM 05/29/2021    2:21 PM  PHQ 2/9 Scores  PHQ - 2 Score 0 0  PHQ- 9 Score 0 5    Dental: No current dental problems and Receives regular dental care  Patient Active Problem List   Diagnosis Date Noted   Environmental allergies 09/20/2022   Acquired foot deformity 06/28/2016   Pes planus of both feet 06/28/2016   Bunion of great toe, unspecified laterality 06/28/2016   Ankle pain 06/28/2016   Varicose veins 06/28/2016   Sleep hypopnea 05/10/2016   OSA (obstructive sleep apnea) 05/10/2016   Nonspecific abnormal electrocardiogram (ECG) (EKG) 02/18/2016   Witnessed apneic spells 01/05/2016   Morbid obesity (HCC) 01/05/2016    Essential hypertension 01/05/2016      Patient Care Team: Karie Georges, MD as PCP - General (Family Medicine) Christell Constant, MD as PCP - Cardiology (Cardiology)   Outpatient Medications Prior to Visit  Medication Sig   carvedilol (COREG) 25 MG tablet TAKE 1 TABLET(25 MG) BY MOUTH TWICE DAILY WITH A MEAL   cetirizine (ZYRTEC) 10 MG tablet Take 1 tablet (10 mg total) by mouth daily.   ferrous sulfate 325 (65 FE) MG tablet Take 325 mg by mouth daily with breakfast.   fluticasone (FLONASE) 50 MCG/ACT nasal spray Place into both nostrils daily.   hydrALAZINE (APRESOLINE) 50 MG tablet Take 1.5 tablets (75 mg total) by mouth 3 (three) times daily.   montelukast (SINGULAIR) 10 MG tablet Take 1 tablet (10 mg total) by mouth at bedtime.   Naltrexone-buPROPion HCl ER 8-90 MG TB12 Start 1 tablet every morning for 7 days, then 1 tablet twice daily for 7 days, then 2 tablets every morning and one in the evening, then 2 tablets twice a day   spironolactone (ALDACTONE) 100 MG tablet Take 1 tablet (100 mg total) by mouth daily.   valsartan-hydrochlorothiazide (DIOVAN-HCT) 320-25 MG tablet Take 1 tablet by mouth daily.   No facility-administered medications prior to visit.    Review of Systems  HENT:  Negative for hearing loss.   Eyes:  Negative for  blurred vision.  Respiratory:  Negative for shortness of breath.   Cardiovascular:  Negative for chest pain.  Gastrointestinal: Negative.   Genitourinary: Negative.   Musculoskeletal:  Negative for back pain.  Neurological:  Negative for headaches.  Psychiatric/Behavioral:  Negative for depression.           Objective:     BP 132/82 (BP Location: Left Arm, Patient Position: Sitting, Cuff Size: Large)   Pulse 85   Temp 97.8 F (36.6 C) (Oral)   Ht 5\' 4"  (1.626 m)   Wt (!) 360 lb 9.6 oz (163.6 kg)   LMP 12/30/2021   SpO2 97%   BMI 61.90 kg/m  BP Readings from Last 3 Encounters:  09/20/22 132/82  08/16/22 128/78  06/21/22  130/88   Wt Readings from Last 3 Encounters:  09/20/22 (!) 360 lb 9.6 oz (163.6 kg)  08/16/22 (!) 358 lb (162.4 kg)  06/21/22 (!) 358 lb 11.2 oz (162.7 kg)      Physical Exam Vitals reviewed. Exam conducted with a chaperone present.  Constitutional:      Appearance: She is morbidly obese.  Cardiovascular:     Rate and Rhythm: Normal rate and regular rhythm.     Pulses: Normal pulses.     Heart sounds: Normal heart sounds.  Pulmonary:     Effort: Pulmonary effort is normal.  Chest:  Breasts:    Tanner Score is 5.  Abdominal:     Palpations: Abdomen is soft.  Genitourinary:    General: Normal vulva.     Exam position: Lithotomy position.     Tanner stage (genital): 5.     Vagina: Normal.     Cervix: Normal.     Uterus: Normal.      Adnexa: Right adnexa normal and left adnexa normal.     Rectum: Normal.  Neurological:     General: No focal deficit present.     Mental Status: She is alert and oriented to person, place, and time.  Psychiatric:        Mood and Affect: Mood and affect normal.      No results found for any visits on 09/20/22.     Assessment & Plan:    Routine Health Maintenance and Physical Exam  Immunization History  Administered Date(s) Administered   Janssen (J&J) SARS-COV-2 Vaccination 02/07/2020   PFIZER(Purple Top)SARS-COV-2 Vaccination 10/29/2020   Td 06/10/2015    Health Maintenance  Topic Date Due   Hepatitis C Screening  Never done   PAP SMEAR-Modifier  06/09/2018   COVID-19 Vaccine (3 - Booster for Janssen series) 10/06/2022 (Originally 12/24/2020)   INFLUENZA VACCINE  01/30/2023 (Originally 06/01/2022)   HIV Screening  Completed   HPV VACCINES  Aged Out    Discussed health benefits of physical activity, and encouraged her to engage in regular exercise appropriate for her age and condition.  Problem List Items Addressed This Visit       Unprioritized   Morbid obesity [facility])    Patient was unable to get the [facility] approved. I  recommended that we start with phentermine 15 mg capsule daily for appetite suppression and she will call her insurance company to find out what the requirements are for wegovy treatment.      Relevant Medications   phentermine 15 MG capsule   Environmental allergies (Chronic)    On singulair 10 mg daily, flonase spray daily, and zyrte 10 mg daily. Pt continues to have sinus pressure and symptoms, will try adding azelastine nasal spray  to her regimen. If this does not improve her s xthen will refer her to the allergist.       Relevant Medications   Azelastine HCl 0.15 % SOLN   Other Visit Diagnoses     Well woman exam with routine gynecological exam    -  Primary   Relevant Orders   Cytology - PAP -- Normal physical exam findings today.   Breast cancer screening by mammogram       Relevant Orders   MM Digital Screening-- will be turning 42 in January, will start mammograms then.      Return in about 3 months (around 12/21/2022) for Follow up medication refills.     Karie Georges, MD

## 2022-09-21 ENCOUNTER — Telehealth: Payer: Self-pay | Admitting: *Deleted

## 2022-09-21 LAB — CYTOLOGY - PAP
Chlamydia: NEGATIVE
Comment: NEGATIVE
Comment: NEGATIVE
Comment: NEGATIVE
Comment: NORMAL
Diagnosis: NEGATIVE
High risk HPV: NEGATIVE
Neisseria Gonorrhea: NEGATIVE
Trichomonas: NEGATIVE

## 2022-09-21 NOTE — Telephone Encounter (Signed)
Fax received stating the request was denied and given to PCP for review. 

## 2022-09-21 NOTE — Telephone Encounter (Signed)
Message sent to PCP as per Dahlia Client at [employer] pay price is $45.99.

## 2022-09-21 NOTE — Telephone Encounter (Signed)
[facility] faxed a prior auth request for Phentermine 15mg .  PA was sent to [email or web address]-(Key: ) pending review by insurance.

## 2022-09-21 NOTE — Telephone Encounter (Signed)
Can you ask pt how much she would have to pay if she just bought them without her insurance? They shouldn't bee too expensive

## 2022-09-22 ENCOUNTER — Other Ambulatory Visit: Payer: Self-pay | Admitting: Family Medicine

## 2022-09-22 MED ORDER — PHENTERMINE HCL 37.5 MG PO TABS: 18.7500 mg | ORAL_TABLET | Freq: Two times a day (BID) | ORAL | 0 refills | Status: DC

## 2022-09-22 NOTE — Progress Notes (Signed)
Normal pap smear.

## 2022-09-22 NOTE — Telephone Encounter (Signed)
Ok I will send in the tablets -- if she goes to [email or web address] she can download the coupon and the medication should be less than 20 dollars

## 2022-09-22 NOTE — Telephone Encounter (Signed)
Left a detailed message with the information below at the patient's cell number. ?

## 2022-10-12 ENCOUNTER — Other Ambulatory Visit: Payer: Self-pay | Admitting: Family Medicine

## 2022-10-12 DIAGNOSIS — J301 Allergic rhinitis due to pollen: Secondary | ICD-10-CM

## 2022-10-28 ENCOUNTER — Encounter: Payer: Self-pay | Admitting: Family Medicine

## 2022-11-02 MED ORDER — PHENTERMINE HCL 37.5 MG PO TABS: ORAL_TABLET | ORAL | 0 refills | Status: DC

## 2022-11-02 MED ORDER — PHENTERMINE HCL 37.5 MG PO TABS: 18.7500 mg | ORAL_TABLET | Freq: Two times a day (BID) | ORAL | 0 refills | Status: DC

## 2022-11-18 ENCOUNTER — Telehealth: Payer: Self-pay

## 2022-11-18 ENCOUNTER — Other Ambulatory Visit (HOSPITAL_COMMUNITY): Payer: Self-pay

## 2022-11-18 NOTE — Telephone Encounter (Signed)
Noted  

## 2022-11-18 NOTE — Telephone Encounter (Signed)
Pharmacy Patient Advocate Encounter   Received notification from [facility] that prior authorization for Phentermine 37.5mg  tabs is required/requested.  Per Test Claim: Prior auth required   PA submitted on 11/18/22 to (ins) [employer] via CoverMyMeds Key  [id number] Status is pending

## 2022-11-19 NOTE — Telephone Encounter (Signed)
Patient Advocate Encounter   Received a fax from [facility] regarding Prior Authorization for Phentermine 37.5mg  tabs .   Authorization has been DENIED due to .   Determination letter attached to patient chart

## 2022-11-23 ENCOUNTER — Telehealth (INDEPENDENT_AMBULATORY_CARE_PROVIDER_SITE_OTHER): Payer: BC Managed Care – PPO | Admitting: Family Medicine

## 2022-11-23 DIAGNOSIS — U071 COVID-19: Secondary | ICD-10-CM

## 2022-11-23 MED ORDER — PSEUDOEPH-BROMPHEN-DM 30-2-10 MG/5ML PO SYRP: 5.0000 mL | ORAL_SOLUTION | Freq: Four times a day (QID) | ORAL | 0 refills | Status: DC | PRN

## 2022-11-23 NOTE — Progress Notes (Signed)
   Acute Office Visit  Subjective:     Patient ID: Kathy [name], female    DOB: 15-Oct-1981, 42 y.o.   MRN: [id number]  No chief complaint on file.  I connected with  Lawerance Bach on 33/00/76 by a video enabled telemedicine application and verified that I am speaking with the correct person using two identifiers.   I discussed the limitations of evaluation and management by telemedicine. The patient expressed understanding and agreed to proceed.  HPI  Patient location: Home address  Provider location: [place]  Patient is in today for positive COVID test. Patient reports that she has been sick for 6 days, pressure in her face, coughing with lots of mucus, no subjective fever, states her sinuses are clogged, states that she took the COVID test 3 days ago, states that her symptoms are starting to get better, no difficulty breathing or chest pain currently.   Review of Systems  All other systems reviewed and are negative.       Objective:    There were no vitals taken for this visit.   Physical Exam Constitutional:      Appearance: Normal appearance. She is obese.  Eyes:     Conjunctiva/sclera: Conjunctivae normal.  Pulmonary:     Effort: Pulmonary effort is normal.  Neurological:     Mental Status: She is alert and oriented to person, place, and time. Mental status is at baseline.  Psychiatric:        Mood and Affect: Mood normal.     No results found for any visits on 11/23/22.      Assessment & Plan:   Problem List Items Addressed This Visit   None Visit Diagnoses     COVID-19    -  Primary   Relevant Medications   brompheniramine-pseudoephedrine-DM 30-2-10 MG/5ML syrup     Patient is outside the window for Paxlovid treatment and her symptoms are getting somewhat better today. I advised that she may use a decongestant syrup as needed for her sinus congestion and pressure. Work excuse written for patient and posted on Mychart. Patient to  follow up if sx worsen or persist.   Meds ordered this encounter  Medications   brompheniramine-pseudoephedrine-DM 30-2-10 MG/5ML syrup    Sig: Take 5-10 mLs by mouth 4 (four) times daily as needed.    Dispense:  473 mL    Refill:  0    No follow-ups on file.  Farrel Conners, MD

## 2022-12-07 NOTE — Telephone Encounter (Signed)
Since she needs paperwork filled out I need her to come in for an appointment so I can fill this out for her. Please have her schedule an appointmetn.

## 2022-12-09 ENCOUNTER — Ambulatory Visit (INDEPENDENT_AMBULATORY_CARE_PROVIDER_SITE_OTHER): Payer: BC Managed Care – PPO | Admitting: Family Medicine

## 2022-12-09 ENCOUNTER — Encounter: Payer: Self-pay | Admitting: Family Medicine

## 2022-12-09 DIAGNOSIS — U071 COVID-19: Secondary | ICD-10-CM

## 2022-12-09 MED ORDER — PHENTERMINE HCL 37.5 MG PO TABS: 18.7500 mg | ORAL_TABLET | Freq: Two times a day (BID) | ORAL | 0 refills | Status: DC

## 2022-12-09 NOTE — Assessment & Plan Note (Signed)
Doing very well on the medication, patient has lost 19 pounds since her last visit. I will fill another 3 months of phentermine 37.5 mg 1/2 tablet BID and I will see her back in 3 months for a weight check.

## 2022-12-09 NOTE — Progress Notes (Signed)
   Established Patient Office Visit  Subjective   Patient ID: Kathy [name], female    DOB: Aug 25, 1981  Age: 42 y.o. MRN: [id number]  Chief Complaint  Patient presents with   patient requests paperwork for FMLA    Patient is here for FMLA paperwork to be filled out from when she had [place]. Pt reports she tested positive on 11/19/22 and she was out of work for 7 days, went back on 11/26/2022. She reports no residual symptoms since then, however her supervisor requested FMLA paperwork in case she ever had reinfection.   MO-- pt reports she has been taking the phentermine 37.5 mg 1/2 tablet twice daily. States she is not having any side effects to the medication, tolerating it well. She has noticed that she is less hungry, she is also using meal replacements to help reduce calories. She has lost 19 pounds since her last visit, from 360 --> 341. Pt is very happy about this change and wishes to continue treatment.        Review of Systems  All other systems reviewed and are negative.     Objective:     BP 132/82 (BP Location: Right Arm, Patient Position: Sitting, Cuff Size: Large)   Pulse 100   Temp 97.6 F (36.4 C) (Oral)   Ht 5\' 4"  (1.626 m)   Wt (!) 341 lb 11.2 oz (155 kg)   SpO2 99%   BMI 58.65 kg/m     Physical Exam Vitals reviewed.  Constitutional:      Appearance: Normal appearance. She is morbidly obese.  Eyes:     Conjunctiva/sclera: Conjunctivae normal.  Pulmonary:     Effort: Pulmonary effort is normal.  Neurological:     General: No focal deficit present.     Mental Status: She is alert and oriented to person, place, and time. Mental status is at baseline.  Psychiatric:        Mood and Affect: Mood normal.        Behavior: Behavior normal.        Thought Content: Thought content normal.        Judgment: Judgment normal.      No results found for any visits on 12/09/22.     The 10-year ASCVD risk score (Arnett DK, et al., 2019) is: 2.8%     Assessment & Plan:   Problem List Items Addressed This Visit       Unprioritized   Morbid obesity ([place]) - Primary    Doing very well on the medication, patient has lost 19 pounds since her last visit. I will fill another 3 months of phentermine 37.5 mg 1/2 tablet BID and I will see her back in 3 months for a weight check.       Relevant Medications   phentermine (ADIPEX-P) 37.5 MG tablet   phentermine (ADIPEX-P) 37.5 MG tablet   phentermine (ADIPEX-P) 37.5 MG tablet (Start on 02/01/2023)   Other Visit Diagnoses     COVID-19       resolved, no residual symptoms, FMLA paperwork filled out with patient.       Return in about 3 months (around 03/09/2023) for weight loss.    Farrel Conners, MD

## 2022-12-13 ENCOUNTER — Telehealth: Payer: Self-pay | Admitting: *Deleted

## 2022-12-13 ENCOUNTER — Ambulatory Visit
Admission: RE | Admit: 2022-12-13 | Discharge: 2022-12-13 | Disposition: A | Discharge status: Home / Self Care | Payer: BC Managed Care – PPO | Source: Ambulatory Visit | Attending: Family Medicine | Admitting: Family Medicine

## 2022-12-13 DIAGNOSIS — Z1231 Encounter for screening mammogram for malignant neoplasm of breast: Secondary | ICD-10-CM

## 2022-12-13 NOTE — Telephone Encounter (Signed)
[facility] faxed a prior auth request for Phentermine 37.25m tablets.  PA was sent to [email or web address]-key-[id number] pending review by insurance.

## 2022-12-14 ENCOUNTER — Other Ambulatory Visit: Payer: Self-pay | Admitting: Family Medicine

## 2022-12-14 DIAGNOSIS — J301 Allergic rhinitis due to pollen: Secondary | ICD-10-CM

## 2022-12-14 NOTE — Telephone Encounter (Signed)
Fax received stating the request was denied and given to PCP for review.

## 2022-12-14 NOTE — Telephone Encounter (Signed)
Noted  

## 2022-12-14 NOTE — Telephone Encounter (Signed)
Left a detailed message with the information below at the patient's cell number. ?

## 2022-12-14 NOTE — Telephone Encounter (Signed)
Patient called to say that was fine and she was willing to pay out of pocket

## 2022-12-14 NOTE — Telephone Encounter (Signed)
It was denied-- she can pay for it out of pocket-- it should only cost around 15-20 dollars

## 2023-02-11 ENCOUNTER — Other Ambulatory Visit: Payer: Self-pay | Admitting: Family Medicine

## 2023-02-11 DIAGNOSIS — J301 Allergic rhinitis due to pollen: Secondary | ICD-10-CM

## 2023-03-06 IMAGING — DX DG LUMBAR SPINE COMPLETE 4+V
5 series · 5 of 5 positions shown · non-contrast
Comparison: None.

CLINICAL DATA: Flank pain

EXAM:
LUMBAR SPINE - COMPLETE 4+ VIEW

[l-spine ap]
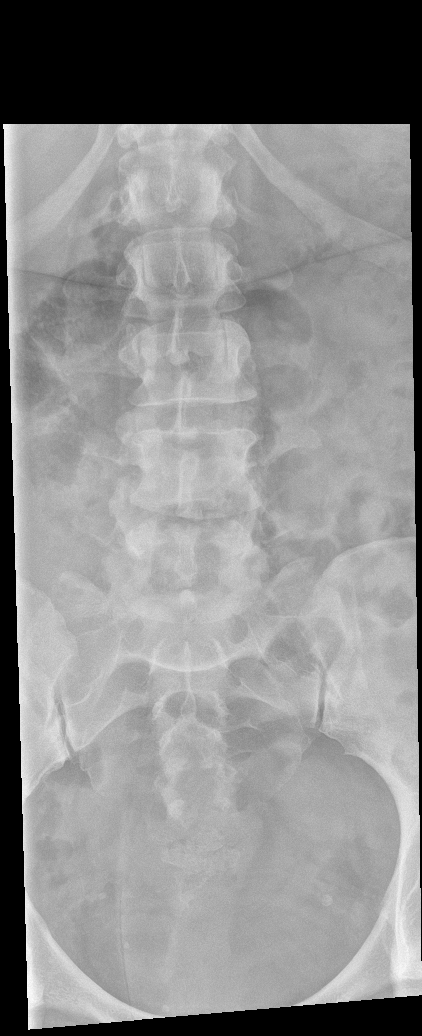

[l-spine obl (1 of 2)]
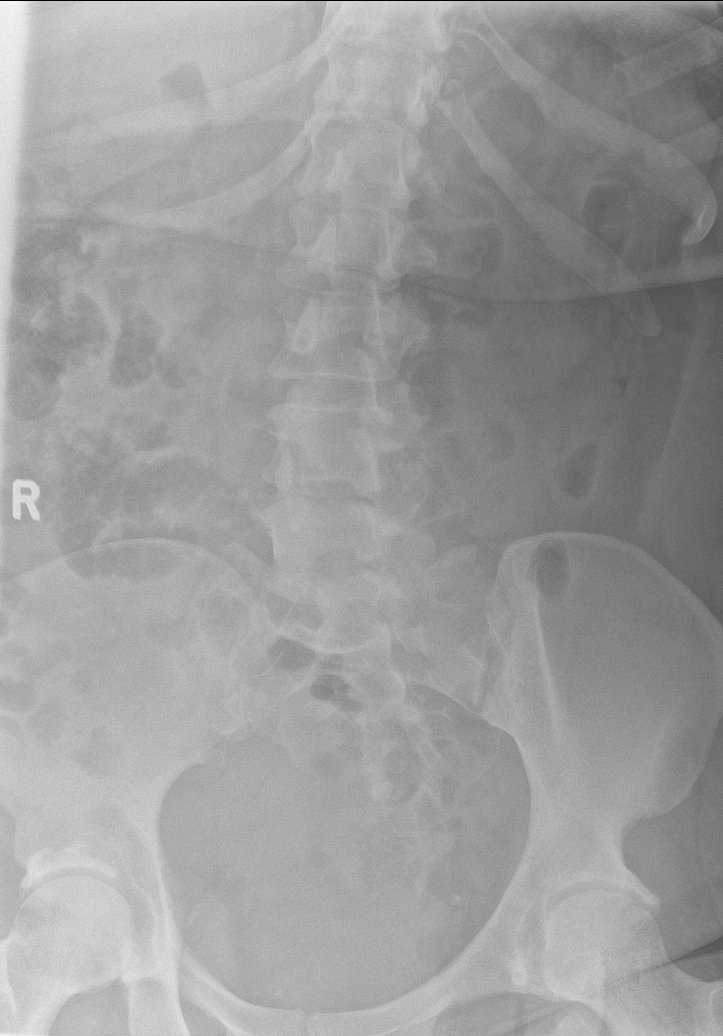

[l-spine obl (2 of 2)]
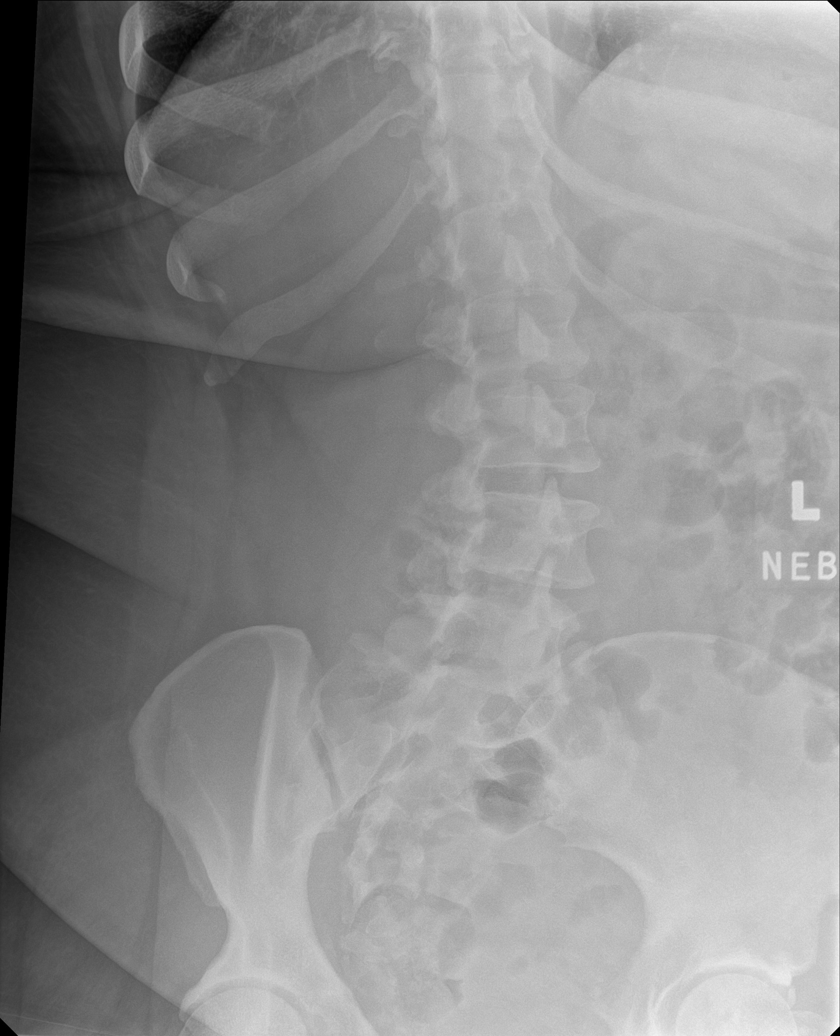

[l-spine lat]
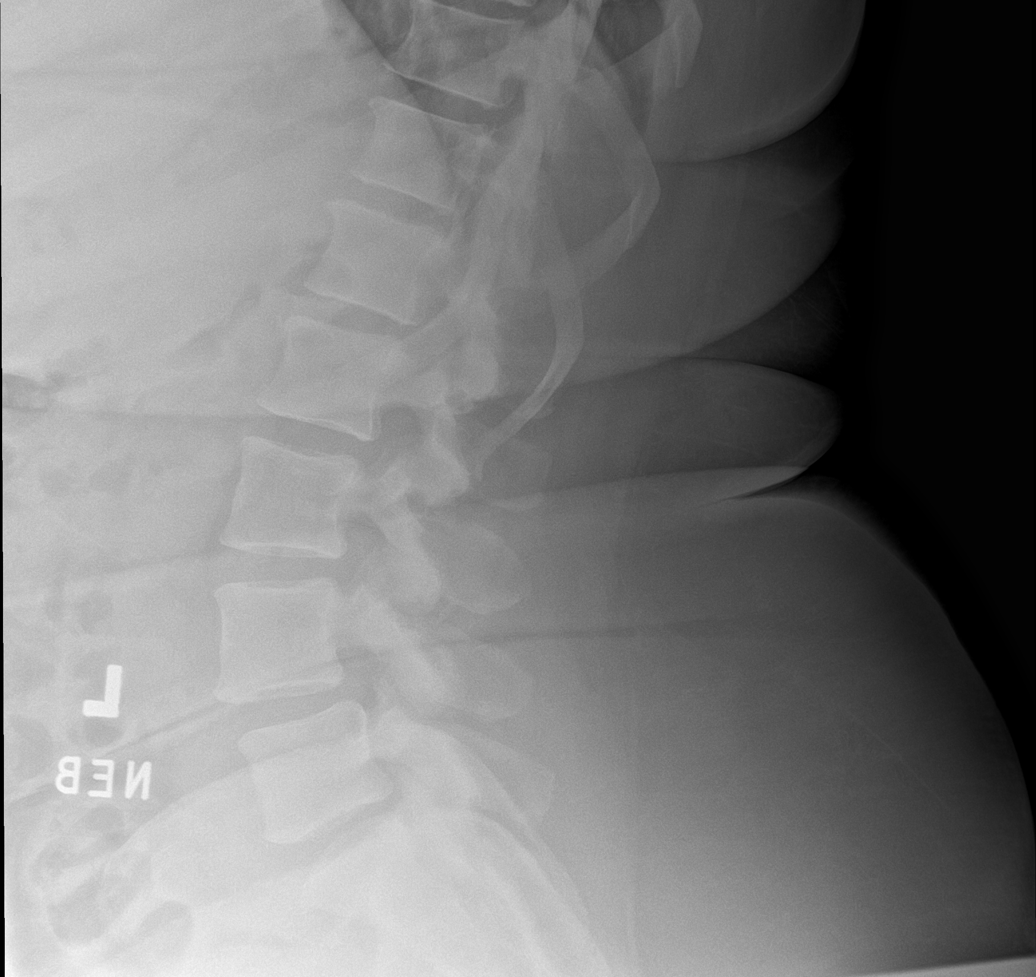

[l-spine spot]
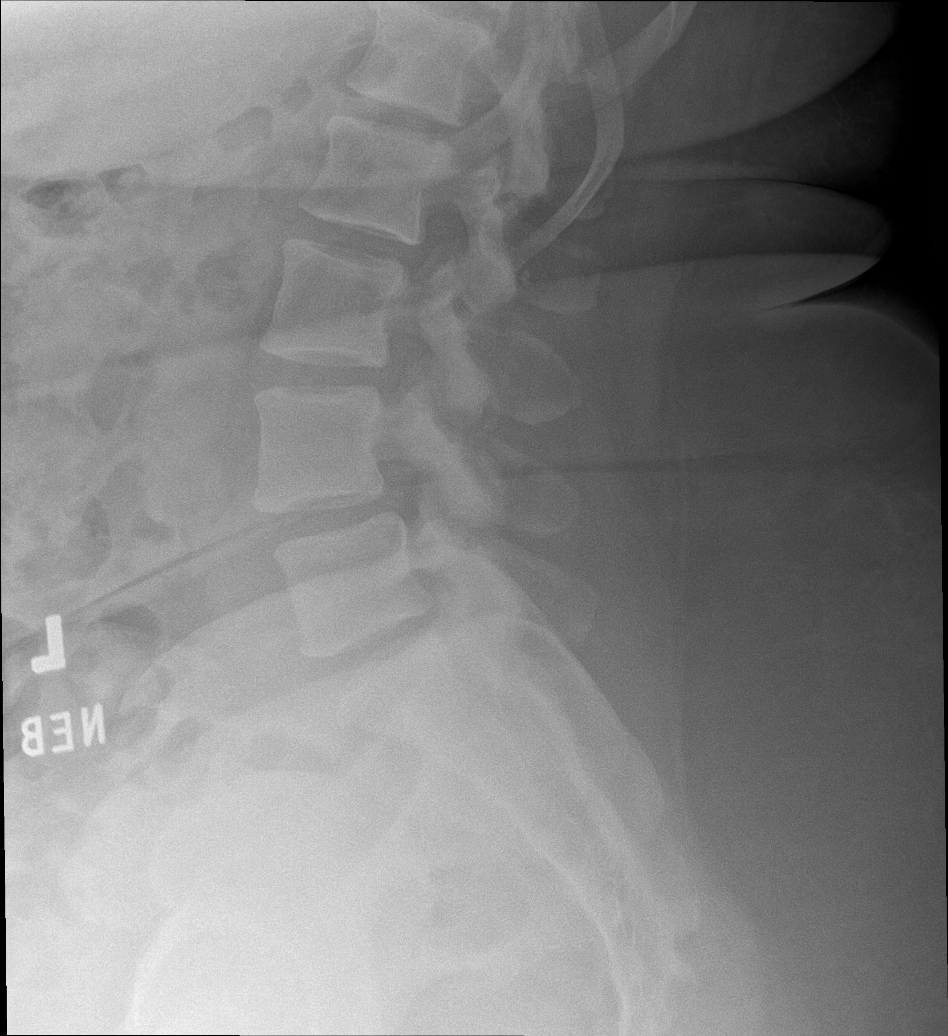

[5 of 5 positions shown; findings below may reference images not displayed]

FINDINGS: Normal alignment of lumbar vertebral bodies. No loss of vertebral
body height or disc height. No pars fracture. No subluxation.
IMPRESSION: Normal lumbar radiographs.

## 2023-03-08 ENCOUNTER — Other Ambulatory Visit: Payer: Self-pay | Admitting: Internal Medicine

## 2023-03-11 ENCOUNTER — Other Ambulatory Visit: Payer: Self-pay | Admitting: Internal Medicine

## 2023-03-14 ENCOUNTER — Encounter: Payer: Self-pay | Admitting: Family Medicine

## 2023-03-14 ENCOUNTER — Ambulatory Visit (INDEPENDENT_AMBULATORY_CARE_PROVIDER_SITE_OTHER): Payer: BC Managed Care – PPO | Admitting: Family Medicine

## 2023-03-14 DIAGNOSIS — Z6841 Body Mass Index (BMI) 40.0 and over, adult: Secondary | ICD-10-CM

## 2023-03-14 MED ORDER — ZEPBOUND 5 MG/0.5ML ~~LOC~~ SOAJ: 5.0000 mg | SUBCUTANEOUS | 0 refills | Status: DC

## 2023-03-14 MED ORDER — ZEPBOUND 2.5 MG/0.5ML ~~LOC~~ SOAJ: 2.5000 mg | SUBCUTANEOUS | 0 refills | Status: DC

## 2023-03-14 MED ORDER — ZEPBOUND 7.5 MG/0.5ML ~~LOC~~ SOAJ: 7.5000 mg | SUBCUTANEOUS | 0 refills | Status: DC

## 2023-03-14 MED ORDER — PHENTERMINE HCL 37.5 MG PO TABS: 37.5000 mg | ORAL_TABLET | Freq: Every day | ORAL | 0 refills | Status: DC

## 2023-03-14 MED ORDER — PHENTERMINE HCL 37.5 MG PO TABS: 37.5000 mg | ORAL_TABLET | Freq: Two times a day (BID) | ORAL | 0 refills | Status: DC

## 2023-03-14 NOTE — Progress Notes (Signed)
Established Patient Office Visit  Subjective   Patient ID: Kathy [name], female    DOB: [birth year]  Age: 42 y.o. MRN: [id number]  Chief Complaint  Patient presents with   Medical Management of Chronic Issues    Pt is here for follow up on weight loss. She reports she is taking the whole tablet of phentermine once a day now. She reports it is doing well to control her appetite, however she has only lost about 5 pounds in the last 3 months. She is using calorie restriction and exercise, she is using meal replacement shakes also to help her with this strategy. She has participated in this physician supervised weight loss program for 6 months and has achieved some success, however the weight loss appears to be slowing at this time. We discussed switching to Zepbound and she is agreeable.     Current Outpatient Medications  Medication Instructions   Azelastine HCl 0.15 % SOLN 1 spray, Nasal, Daily   brompheniramine-pseudoephedrine-DM 30-2-10 MG/5ML syrup 5-10 mLs, Oral, 4 times daily PRN   carvedilol (COREG) 25 MG tablet TAKE 1 TABLET(25 MG) BY MOUTH TWICE DAILY WITH A MEAL   cetirizine (ZYRTEC) 10 mg, Oral, Daily   ferrous sulfate 325 mg, Oral, Daily with breakfast   fluticasone (FLONASE) 50 MCG/ACT nasal spray Each Nare, Daily   hydrALAZINE (APRESOLINE) 75 mg, Oral, 3 times daily   montelukast (SINGULAIR) 10 MG tablet TAKE 1 TABLET(10 MG) BY MOUTH AT BEDTIME   phentermine (ADIPEX-P) 18.75 mg, Oral, 2 times daily, Take 1/2 tablet after the first meal of the day and the second 1/2 tablet after the second meal of the day   [START ON 04/12/2023] phentermine (ADIPEX-P) 37.5 mg, Oral, Daily before breakfast, Take 1/2 tablet after the first meal of the day and the second 1/2 tablet after the second meal of the day   phentermine (ADIPEX-P) 37.5 mg, Oral, 2 times daily, Take 1/2 tablet after the first meal of the day and the second 1/2 tablet after the second meal of the day   spironolactone  (ALDACTONE) 100 MG tablet TAKE 1 TABLET(100 MG) BY MOUTH DAILY   valsartan-hydrochlorothiazide (DIOVAN-HCT) 320-25 MG tablet 1 tablet, Oral, Daily   Zepbound 2.5 mg, Subcutaneous, Weekly, Body mass index is 57.81 kg/m   Zepbound 5 mg, Subcutaneous, Weekly   Zepbound 7.5 mg, Subcutaneous, Weekly    Patient Active Problem List   Diagnosis Date Noted   Environmental allergies 09/20/2022   Acquired foot deformity 06/28/2016   Pes planus of both feet 06/28/2016   Bunion of great toe, unspecified laterality 06/28/2016   Ankle pain 06/28/2016   Varicose veins 06/28/2016   Sleep hypopnea 05/10/2016   OSA (obstructive sleep apnea) 05/10/2016   Nonspecific abnormal electrocardiogram (ECG) (EKG) 02/18/2016   Witnessed apneic spells 01/05/2016   Morbid obesity (HCC) 01/05/2016   Essential hypertension 01/05/2016      Review of Systems  All other systems reviewed and are negative.     Objective:     BP 104/80 (BP Location: Left Arm, Patient Position: Sitting, Cuff Size: Large)   Pulse 94   Temp 97.8 F (36.6 C) (Oral)   Ht 5\' 4"  (1.626 m)   Wt (!) 336 lb 12.8 oz (152.8 kg)   SpO2 99%   BMI 57.81 kg/m    Physical Exam Vitals reviewed.  Constitutional:      Appearance: Normal appearance. She is well-groomed. She is morbidly obese.  Eyes:     Conjunctiva/sclera: Conjunctivae normal.  Cardiovascular:     Rate and Rhythm: Normal rate and regular rhythm.     Pulses: Normal pulses.     Heart sounds: S1 normal and S2 normal. Murmur heard.  Pulmonary:     Effort: Pulmonary effort is normal.     Breath sounds: Normal breath sounds and air entry.  Neurological:     Mental Status: She is alert and oriented to person, place, and time. Mental status is at baseline.     Gait: Gait is intact.  Psychiatric:        Mood and Affect: Mood and affect normal.        Speech: Speech normal.        Behavior: Behavior normal.      No results found for any visits on 03/14/23.    The  10-year ASCVD risk score (Arnett DK, et al., 2019) is: 0.7%    Assessment & Plan:  Morbid obesity (HCC) Assessment & Plan: Patient has participated in a physician supervised, calorie/ carb restricted diet program for the past 6 months. She has had some success with phentermine, however weight loss is starting to slow down over the past 3 months. I recommend switching to Zepbound to get better weight loss results. She will use the Zepbound in conjunction with her continued dietary restriction/ meal replacement shakes, and exercise. I will continue to see the patient every 3 months for weight checks and to answer any questions.   Given the fact that she has continued to participate in the program for more than 6 months this should qualify her for the Zepbound.   Orders: -     Phentermine HCl; Take 1 tablet (37.5 mg total) by mouth daily before breakfast. Take 1/2 tablet after the first meal of the day and the second 1/2 tablet after the second meal of the day  Dispense: 30 tablet; Refill: 0 -     Phentermine HCl; Take 1 tablet (37.5 mg total) by mouth in the morning and at bedtime. Take 1/2 tablet after the first meal of the day and the second 1/2 tablet after the second meal of the day  Dispense: 30 tablet; Refill: 0 -     Zepbound; Inject 2.5 mg into the skin once a week. Body mass index is 57.81 kg/m  Dispense: 0.5 mL; Refill: 0 -     Zepbound; Inject 5 mg into the skin once a week.  Dispense: 2 mL; Refill: 0 -     Zepbound; Inject 7.5 mg into the skin once a week.  Dispense: 2 mL; Refill: 0     Return in about 3 months (around 06/14/2023) for weight loss.    Karie Georges, MD

## 2023-03-14 NOTE — Patient Instructions (Signed)
Go on the [employer] to download the savings cards-- will lower your co pay on the medication.

## 2023-03-14 NOTE — Assessment & Plan Note (Addendum)
Patient has participated in a physician supervised, calorie/ carb restricted diet program for the past 6 months. She has had some success with phentermine, however weight loss is starting to slow down over the past 3 months. I recommend switching to Zepbound to get better weight loss results. She will use the Zepbound in conjunction with her continued dietary restriction/ meal replacement shakes, and exercise. I will continue to see the patient every 3 months for weight checks and to answer any questions.   Given the fact that she has continued to participate in the program for more than 6 months this should qualify her for the Zepbound.

## 2023-03-16 ENCOUNTER — Other Ambulatory Visit: Payer: Self-pay | Admitting: Internal Medicine

## 2023-03-24 ENCOUNTER — Telehealth: Payer: Self-pay

## 2023-03-24 NOTE — Telephone Encounter (Signed)
PA approved.

## 2023-03-24 NOTE — Telephone Encounter (Signed)
PA initiated via Covermymeds; KEY: [id number]. Awaiting determination.

## 2023-03-25 NOTE — Telephone Encounter (Signed)
Left a detailed message with the information below on the voicemail at [facility].

## 2023-04-20 ENCOUNTER — Other Ambulatory Visit: Payer: Self-pay | Admitting: Family Medicine

## 2023-04-20 NOTE — Telephone Encounter (Signed)
She is supposed to increase to the 5 mg for this month-- this was already sent in to her pharmacy-- just have the patient call the pharmacy and ask to fill the 5 mg

## 2023-04-23 ENCOUNTER — Other Ambulatory Visit: Payer: Self-pay | Admitting: Family Medicine

## 2023-04-23 DIAGNOSIS — J301 Allergic rhinitis due to pollen: Secondary | ICD-10-CM

## 2023-06-05 ENCOUNTER — Encounter: Payer: Self-pay | Admitting: Family Medicine

## 2023-06-10 ENCOUNTER — Other Ambulatory Visit (HOSPITAL_COMMUNITY): Payer: Self-pay

## 2023-06-10 ENCOUNTER — Telehealth: Payer: Self-pay

## 2023-06-10 NOTE — Telephone Encounter (Signed)
Pharmacy Patient Advocate Encounter   Received notification from CoverMyMeds that prior authorization for Phentermine HCl 37.5MG  tablets is required/requested.   Insurance verification completed.   The patient is insured through [facility] .   Per test claim: PA required; PA submitted to [facility] via CoverMyMeds Key/confirmation #/EOC  [id number] Status is pending

## 2023-06-13 ENCOUNTER — Ambulatory Visit: Payer: BC Managed Care – PPO | Admitting: Family Medicine

## 2023-06-13 ENCOUNTER — Encounter: Payer: Self-pay | Admitting: Family Medicine

## 2023-06-13 DIAGNOSIS — Z6841 Body Mass Index (BMI) 40.0 and over, adult: Secondary | ICD-10-CM

## 2023-06-13 DIAGNOSIS — I10 Essential (primary) hypertension: Secondary | ICD-10-CM

## 2023-06-13 LAB — COMPREHENSIVE METABOLIC PANEL
ALT: 14 U/L (ref 0–35)
AST: 14 U/L (ref 0–37)
Albumin: 3.8 g/dL (ref 3.5–5.2)
Alkaline Phosphatase: 55 U/L (ref 39–117)
BUN: 31 mg/dL — ABNORMAL HIGH (ref 6–23)
CO2: 23 mEq/L (ref 19–32)
Calcium: 8.9 mg/dL (ref 8.4–10.5)
Chloride: 101 mEq/L (ref 96–112)
Creatinine, Ser: 1.2 mg/dL (ref 0.40–1.20)
GFR: 56.6 mL/min — ABNORMAL LOW (ref >60.00)
Glucose, Bld: 79 mg/dL (ref 70–99)
Potassium: 4.2 mEq/L (ref 3.5–5.1)
Sodium: 130 mEq/L — ABNORMAL LOW (ref 135–145)
Total Bilirubin: 0.3 mg/dL (ref 0.2–1.2)
Total Protein: 7.4 g/dL (ref 6.0–8.3)

## 2023-06-13 LAB — LIPID PANEL
Cholesterol: 183 mg/dL (ref 0–200)
HDL: 38.2 mg/dL — ABNORMAL LOW
LDL Cholesterol: 114 mg/dL — ABNORMAL HIGH (ref 0–99)
NonHDL: 144.35
Total CHOL/HDL Ratio: 5
Triglycerides: 151 mg/dL — ABNORMAL HIGH (ref 0.0–149.0)
VLDL: 30.2 mg/dL (ref 0.0–40.0)

## 2023-06-13 LAB — HEMOGLOBIN A1C: Hgb A1c MFr Bld: 5.3 % (ref 4.6–6.5)

## 2023-06-13 MED ORDER — ZEPBOUND 12.5 MG/0.5ML ~~LOC~~ SOAJ: 12.5000 mg | SUBCUTANEOUS | 0 refills | Status: DC

## 2023-06-13 MED ORDER — ZEPBOUND 15 MG/0.5ML ~~LOC~~ SOAJ: 15.0000 mg | SUBCUTANEOUS | 5 refills | Status: DC

## 2023-06-13 MED ORDER — ZEPBOUND 10 MG/0.5ML ~~LOC~~ SOAJ: 10.0000 mg | SUBCUTANEOUS | 0 refills | Status: DC

## 2023-06-13 NOTE — Assessment & Plan Note (Signed)
Needs new A1C and labs today, I have written for the next 3 dosages of the zepbound. Her weight did increase from the last visit but she had difficulty getting the zepbound initially. Will see her back in 3 months for another weight check.

## 2023-06-13 NOTE — Progress Notes (Signed)
Established Patient Office Visit  Subjective   Patient ID: Kathy [name], female    DOB: [birth year]  Age: 42 y.o. MRN: [id number]  Chief Complaint  Patient presents with   Medical Management of Chronic Issues    Patient is here for follow up and weight check, she reports that she did get the zepbound, states that she is just now starting the 7.5 mg dose, was not able to get it for about a month. States that she is tolerating the medication fairly well, no nausea or vomiting. States that she did stat having a little constipation but she is increasing her water intake so this is helping. We discussed what to expect moving forward as she increases the dose of the zepbound.     Current Outpatient Medications  Medication Instructions   Azelastine HCl 0.15 % SOLN 1 spray, Nasal, Daily   brompheniramine-pseudoephedrine-DM 30-2-10 MG/5ML syrup 5-10 mLs, Oral, 4 times daily PRN   carvedilol (COREG) 25 MG tablet TAKE 1 TABLET(25 MG) BY MOUTH TWICE DAILY WITH A MEAL   cetirizine (ZYRTEC) 10 mg, Oral, Daily   ferrous sulfate 325 mg, Oral, Daily with breakfast   fluticasone (FLONASE) 50 MCG/ACT nasal spray Each Nare, Daily   hydrALAZINE (APRESOLINE) 50 MG tablet TAKE 1 AND 1/2 TABLETS BY MOUTH THREE TIMES DAILY   montelukast (SINGULAIR) 10 MG tablet TAKE 1 TABLET(10 MG) BY MOUTH AT BEDTIME   spironolactone (ALDACTONE) 100 MG tablet TAKE 1 TABLET(100 MG) BY MOUTH DAILY   valsartan-hydrochlorothiazide (DIOVAN-HCT) 320-25 MG tablet 1 tablet, Oral, Daily   Zepbound 7.5 mg, Subcutaneous, Weekly   Zepbound 10 mg, Subcutaneous, Weekly   Zepbound 12.5 mg, Subcutaneous, Weekly   Zepbound 15 mg, Subcutaneous, Weekly    Patient Active Problem List   Diagnosis Date Noted   Environmental allergies 09/20/2022   Acquired foot deformity 06/28/2016   Pes planus of both feet 06/28/2016   Bunion of great toe, unspecified laterality 06/28/2016   Ankle pain 06/28/2016   Varicose veins 06/28/2016   Sleep  hypopnea 05/10/2016   OSA (obstructive sleep apnea) 05/10/2016   Nonspecific abnormal electrocardiogram (ECG) (EKG) 02/18/2016   Witnessed apneic spells 01/05/2016   Morbid obesity (HCC) 01/05/2016   Essential hypertension 01/05/2016      Review of Systems  All other systems reviewed and are negative.     Objective:     BP 116/80 (BP Location: Left Arm, Patient Position: Sitting, Cuff Size: Large)   Pulse 85   Temp 98.3 F (36.8 C) (Oral)   Ht 5\' 4"  (1.626 m)   Wt (!) 344 lb (156 kg)   LMP 05/30/2023 (Approximate)   SpO2 98%   BMI 59.05 kg/m    Physical Exam Vitals reviewed.  Constitutional:      Appearance: Normal appearance. She is morbidly obese.  HENT:     Mouth/Throat:     Mouth: Mucous membranes are moist.  Eyes:     Conjunctiva/sclera: Conjunctivae normal.  Pulmonary:     Effort: Pulmonary effort is normal.  Neurological:     Mental Status: She is alert and oriented to person, place, and time. Mental status is at baseline.  Psychiatric:        Mood and Affect: Mood normal.        Behavior: Behavior normal.      No results found for any visits on 06/13/23.    The 10-year ASCVD risk score (Arnett DK, et al., 2019) is: 1.4%    Assessment & Plan:  Morbid obesity (HCC) Assessment & Plan: Needs new A1C and labs today, I have written for the next 3 dosages of the zepbound. Her weight did increase from the last visit but she had difficulty getting the zepbound initially. Will see her back in 3 months for another weight check.   Orders: -     Zepbound; Inject 10 mg into the skin once a week.  Dispense: 2 mL; Refill: 0 -     Zepbound; Inject 12.5 mg into the skin once a week.  Dispense: 2 mL; Refill: 0 -     Zepbound; Inject 15 mg into the skin once a week.  Dispense: 2 mL; Refill: 5 -     Hemoglobin A1c -     Lipid panel  Essential hypertension Assessment & Plan: Well controlled. Needs new CMP today. Current hypertension medications:       Sig    carvedilol (COREG) 25 MG tablet (Taking) TAKE 1 TABLET(25 MG) BY MOUTH TWICE DAILY WITH A MEAL   hydrALAZINE (APRESOLINE) 50 MG tablet (Taking) TAKE 1 AND 1/2 TABLETS BY MOUTH THREE TIMES DAILY   spironolactone (ALDACTONE) 100 MG tablet (Taking) TAKE 1 TABLET(100 MG) BY MOUTH DAILY   valsartan-hydrochlorothiazide (DIOVAN-HCT) 320-25 MG tablet (Taking) TAKE 1 TABLET BY MOUTH DAILY        Orders: -     Comprehensive metabolic panel     Return in about 3 months (around 09/13/2023) for weight loss.    Karie Georges, MD

## 2023-06-13 NOTE — Assessment & Plan Note (Signed)
Well controlled. Needs new CMP today. Current hypertension medications:       Sig   carvedilol (COREG) 25 MG tablet (Taking) TAKE 1 TABLET(25 MG) BY MOUTH TWICE DAILY WITH A MEAL   hydrALAZINE (APRESOLINE) 50 MG tablet (Taking) TAKE 1 AND 1/2 TABLETS BY MOUTH THREE TIMES DAILY   spironolactone (ALDACTONE) 100 MG tablet (Taking) TAKE 1 TABLET(100 MG) BY MOUTH DAILY   valsartan-hydrochlorothiazide (DIOVAN-HCT) 320-25 MG tablet (Taking) TAKE 1 TABLET BY MOUTH DAILY

## 2023-06-13 NOTE — Telephone Encounter (Signed)
Pharmacy Patient Advocate Encounter  Received notification from [facility] that Prior Authorization for Phentermine HCl 37.5MG  tablets has been DENIED. Please advise how you'd like to proceed. Full denial letter will be uploaded to the media tab. See denial reason below.   PA #/Case ID/Reference #: [id number]

## 2023-07-09 ENCOUNTER — Other Ambulatory Visit: Payer: Self-pay | Admitting: Family Medicine

## 2023-07-09 DIAGNOSIS — J301 Allergic rhinitis due to pollen: Secondary | ICD-10-CM

## 2023-07-11 NOTE — Telephone Encounter (Signed)
Rx done. 

## 2023-08-21 NOTE — Progress Notes (Unsigned)
Cardiology Office Note:    Date:  08/22/2023   ID:  Aslean, Bisaillon January 16, 1981, MRN [id number]  PCP:  Karie Georges, MD   [facility] Providers Cardiologist:  Christell Constant, MD     Referring MD: Karie Georges, MD   CC: HTN Follow up   History of Present Illness:    Kathy [name] is a 42 y.o. female with a hx of juvenille HTN, Morbid Obesity, and OSA who presents for evaluation 08/19/21.  We did not receive prior records.  Has seen Pharm D. 2023:  Deferred secondary HTN work up.  Changed insurance  Patient notes that she is doing well.   Since last visit notes that she is tolerating ZepBound with her PCP- this is the best her BP has been . There are no interval hospital/ED visit.   EKG showed SR  No chest pain or pressure .  No SOB/DOE and no PND/Orthopnea.  No weight gain or leg swelling.  No palpitations or syncope. She just bought a house with her mother.    Past Medical History:  Diagnosis Date   Chest pain 2010   hospitalization and eval   Environmental allergies    Hypertension    age 59 yo   Obesity    Wears glasses     Past Surgical History:  Procedure Laterality Date   NO PAST SURGERIES  06/2015    Current Medications: Current Meds  Medication Sig   Azelastine HCl 0.15 % SOLN Place 1 spray into the nose daily.   brompheniramine-pseudoephedrine-DM 30-2-10 MG/5ML syrup Take 5-10 mLs by mouth 4 (four) times daily as needed.   carvedilol (COREG) 25 MG tablet TAKE 1 TABLET(25 MG) BY MOUTH TWICE DAILY WITH A MEAL   cetirizine (ZYRTEC) 10 MG tablet Take 1 tablet (10 mg total) by mouth daily.   ferrous sulfate 325 (65 FE) MG tablet Take 325 mg by mouth daily with breakfast.   fluticasone (FLONASE) 50 MCG/ACT nasal spray Place into both nostrils daily.   hydrALAZINE (APRESOLINE) 50 MG tablet TAKE 1 AND 1/2 TABLETS BY MOUTH THREE TIMES DAILY   montelukast (SINGULAIR) 10 MG tablet TAKE 1 TABLET(10 MG) BY MOUTH AT BEDTIME    spironolactone (ALDACTONE) 100 MG tablet TAKE 1 TABLET(100 MG) BY MOUTH DAILY   tirzepatide (ZEPBOUND) 15 MG/0.5ML Pen Inject 15 mg into the skin once a week.   valsartan-hydrochlorothiazide (DIOVAN-HCT) 320-25 MG tablet TAKE 1 TABLET BY MOUTH DAILY     Allergies:   Ibuprofen, Kiwi extract, and Shrimp [shellfish allergy]   Social History   Socioeconomic History   Marital status: Single    Spouse name: Not on file   Number of children: Not on file   Years of education: Not on file   Highest education level: Some college, no degree  Occupational History   Not on file  Tobacco Use   Smoking status: Former    Types: Cigarettes   Smokeless tobacco: Never   Tobacco comments:    Smoked as a teenager  Advertising account planner   Vaping status: Never Used  Substance and Sexual Activity   Alcohol use: No   Drug use: No   Sexual activity: Never  Other Topics Concern   Not on file  Social History Narrative   Lives with mother.   Works as Research scientist (medical) at [employer], and Public librarian at [employer].  Exercise - goes to [employer], treadmill and weights, goes 2-3 times per week.   Diet -  consistent with 3 meals, but sometimes could be better.  No significant other. No children.    Social Determinants of Health   Financial Resource Strain: Low Risk  (09/16/2022)   Overall Financial Resource Strain (CARDIA)    Difficulty of Paying Living Expenses: Not very hard  Food Insecurity: No Food Insecurity (09/16/2022)   Hunger Vital Sign    Worried About Running Out of Food in the Last Year: Never true    Ran Out of Food in the Last Year: Never true  Transportation Needs: No Transportation Needs (09/16/2022)   PRAPARE - Administrator, Civil Service (Medical): No    Lack of Transportation (Non-Medical): No  Physical Activity: Unknown (09/16/2022)   Exercise Vital Sign    Days of Exercise per Week: 0 days    Minutes of Exercise per Session: Not on file  Stress: No Stress  Concern Present (09/16/2022)   [employer] of Occupational Health - Occupational Stress Questionnaire    Feeling of Stress : Not at all  Social Connections: Moderately Integrated (09/16/2022)   Social Connection and Isolation Panel [NHANES]    Frequency of Communication with Friends and Family: More than three times a week    Frequency of Social Gatherings with Friends and Family: Once a week    Attends Religious Services: More than 4 times per year    Active Member of [employer] or Organizations: Yes    Attends Engineer, structural: More than 4 times per year    Marital Status: Never married    Social: moved from [place] ([place]), 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 Father and Grandparents had refractory HTN  ROS:   Please see the history of present illness.     EKGs/Labs/Other Studies Reviewed:    The following studies were reviewed today:  EKG:   08/16/22: SR with LVH and secondary repol 08/19/21: LVE with Secondary repolarization  Transthoracic Echocardiogram: Date: 08/12/2008 Results: Per report LEFT VENTRICLE:   -  Left ventricular size was normal.   -  Overall left ventricular systolic function was normal.   -  Left ventricular ejection fraction was estimated , range being 50         % to 55 %.   -  There were no left ventricular regional wall motion         abnormalities.   -  Left ventricular wall thickness was increased in a pattern of         mild concentric hypertrophy.     Recent Labs: 06/13/2023: ALT 14; BUN 31; Creatinine, Ser 1.20; Potassium 4.2; Sodium 130  Recent Lipid Panel     Component Value Date/Time   CHOL 183 06/13/2023 1324   CHOL 174 10/30/2021 1509   TRIG 151.0 (H) 06/13/2023 1324   HDL 38.20 (L) 06/13/2023 1324   HDL 43 10/30/2021 1509   CHOLHDL 5 06/13/2023 1324   VLDL 30.2 06/13/2023 1324   LDLCALC 114 (H) 06/13/2023 1324   LDLCALC 103 (H) 10/30/2021 1509    Physical Exam:    VS:  BP (!) 122/98   Pulse (!) 103   Ht 5\' 3"  (1.6 m)   Wt Marland Kitchen)  152 kg   SpO2 97%   BMI 59.34 kg/m     Wt Readings from Last 3 Encounters:  08/22/23 (!) 152 kg  06/13/23 (!) 156 kg  03/14/23 (!) 152.8 kg    Gen: no distress, Morbid obesity   Neck: No JVD Cardiac: No Rubs or Gallops, no murmur, RRR +2 radial pulses Respiratory: Clear to auscultation bilaterally, normal effort, normal  respiratory rate GI: Soft, nontender, non-distended  MS: No  edema;  moves all extremities Integument: Skin feels warm Neuro:  At time of evaluation, alert and oriented to person/place/time/situation  Psych: Normal affect, patient feels ok  ASSESSMENT:    1. OSA (obstructive sleep apnea)   2. Accelerated hypertension   3. Essential hypertension     PLAN:    Refractory HTN- well controlled on current regmein Morbid Obesity OSA - continue Valsatran- hydrochlorothiazide, aldactone, and coreg and hydralazine - Ambulatory BP - we offered renal artery duplex  - OSA is well controlled per patient by other MD - clonidine (drowsiness) amlodipine (swelling) - she is tolerating ZepBound well with no symptoms and with weight loss  Offered one year or PNR; she will see me in one year   Medication Adjustments/Labs and Tests Ordered: Current medicines are reviewed at length with the patient today.  Concerns regarding medicines are outlined above.  Orders Placed This Encounter  Procedures   EKG 12-Lead   No orders of the defined types were placed in this encounter.   Patient Instructions  Medication Instructions:  Your physician recommends that you continue on your current  medications as directed. Please refer to the Current Medication list given to you today.  *If you need a refill on your cardiac medications before your next appointment, please call your pharmacy*   Lab Work: NONE If you have labs (blood work) drawn today and your tests are completely normal, you will receive your results only by: MyChart Message (if you have MyChart) OR A paper copy in the mail If you have any lab test that is abnormal or we need to change your treatment, we will call you to review the results.   Testing/Procedures: Please continue to monitor your BP.    Follow-Up: At [facility], you and your health needs are our priority.  As part of our continuing mission to provide you with exceptional heart care, we have created designated Provider Care Teams.  These Care Teams include your primary Cardiologist (physician) and Advanced Practice Providers (APPs -  Physician Assistants and Nurse Practitioners) who all work together to provide you with the care you need, when you need it.   Your next appointment:   1 year(s)  Provider:   Riley Lam, MD       Signed, Christell Constant, MD  08/22/2023 10:18 AM    [facility]

## 2023-08-22 ENCOUNTER — Encounter: Payer: Self-pay | Admitting: Internal Medicine

## 2023-08-22 ENCOUNTER — Ambulatory Visit: Payer: BC Managed Care – PPO | Attending: Internal Medicine | Admitting: Internal Medicine

## 2023-08-22 VITALS — BP 122/98 | HR 103 | Ht 63.0 in | Wt 335.0 lb

## 2023-08-22 DIAGNOSIS — I10 Essential (primary) hypertension: Secondary | ICD-10-CM | POA: Diagnosis not present

## 2023-08-22 DIAGNOSIS — G4733 Obstructive sleep apnea (adult) (pediatric): Secondary | ICD-10-CM | POA: Diagnosis not present

## 2023-08-22 NOTE — Patient Instructions (Signed)
Medication Instructions:  Your physician recommends that you continue on your current medications as directed. Please refer to the Current Medication list given to you today.  *If you need a refill on your cardiac medications before your next appointment, please call your pharmacy*   Lab Work: NONE If you have labs (blood work) drawn today and your tests are completely normal, you will receive your results only by: MyChart Message (if you have MyChart) OR A paper copy in the mail If you have any lab test that is abnormal or we need to change your treatment, we will call you to review the results.   Testing/Procedures: Please continue to monitor your BP.    Follow-Up: At [facility] you and your health needs are our priority.  As part of our continuing mission to provide you with exceptional heart care, we have created designated Provider Care Teams.  These Care Teams include your primary Cardiologist (physician) and Advanced Practice Providers (APPs -  Physician Assistants and Nurse Practitioners) who all work together to provide you with the care you need, when you need it.   Your next appointment:   1 year(s)  Provider:   Riley Lam, MD

## 2023-09-04 ENCOUNTER — Other Ambulatory Visit: Payer: Self-pay | Admitting: Internal Medicine

## 2023-09-14 ENCOUNTER — Other Ambulatory Visit: Payer: Self-pay | Admitting: Internal Medicine

## 2023-09-19 ENCOUNTER — Ambulatory Visit: Payer: BC Managed Care – PPO | Admitting: Family Medicine

## 2023-11-07 ENCOUNTER — Encounter: Payer: Self-pay | Admitting: Family Medicine

## 2023-11-07 ENCOUNTER — Ambulatory Visit (INDEPENDENT_AMBULATORY_CARE_PROVIDER_SITE_OTHER): Payer: BC Managed Care – PPO | Admitting: Family Medicine

## 2023-11-07 VITALS — BP 112/70 | HR 88 | Temp 98.2°F | Ht 63.0 in | Wt 337.4 lb

## 2023-11-07 DIAGNOSIS — Z789 Other specified health status: Secondary | ICD-10-CM

## 2023-11-07 DIAGNOSIS — R6889 Other general symptoms and signs: Secondary | ICD-10-CM

## 2023-11-07 DIAGNOSIS — Z1231 Encounter for screening mammogram for malignant neoplasm of breast: Secondary | ICD-10-CM

## 2023-11-07 LAB — VITAMIN D 25 HYDROXY (VIT D DEFICIENCY, FRACTURES): VITD: 30.9 ng/mL (ref 30.00–100.00)

## 2023-11-07 LAB — VITAMIN B12: Vitamin B-12: 604 pg/mL (ref 211–911)

## 2023-11-07 MED ORDER — ZEPBOUND 15 MG/0.5ML ~~LOC~~ SOAJ: 15.0000 mg | SUBCUTANEOUS | 5 refills | Status: DC

## 2023-11-07 NOTE — Progress Notes (Signed)
 Established Patient Office Visit  Subjective   Patient ID: Kathy [name], female    DOB: 1981-06-07  Age: 43 y.o. MRN: [id number]  Chief Complaint  Patient presents with   Medical Management of Chronic Issues   patient complains of feeling cold xyrs, requests labs    Pt is here for follow up today, she reports that she was having trouble getting it from the pharmacy due to shortage issues, states that when she was able to get it she would sometimes forget to take her shot, has missed it a few times since the last visit.   Pt reports that she is feeling cold all the time, pt reports she does have irregular periods, but no excessive bleeding. Pt states she is a vegetarian - has been for 6 years, states that she has had this problem for 3 years.    Current Outpatient Medications  Medication Instructions   Azelastine  HCl 0.15 % SOLN 1 spray, Nasal, Daily   brompheniramine-pseudoephedrine-DM 30-2-10 MG/5ML syrup 5-10 mLs, Oral, 4 times daily PRN   carvedilol  (COREG ) 25 MG tablet TAKE 1 TABLET(25 MG) BY MOUTH TWICE DAILY WITH A MEAL   cetirizine  (ZYRTEC ) 10 mg, Oral, Daily   ferrous sulfate 325 mg, Daily with breakfast   fluticasone (FLONASE) 50 MCG/ACT nasal spray Daily   hydrALAZINE  (APRESOLINE ) 50 MG tablet TAKE 1 AND 1/2 TABLETS BY MOUTH THREE TIMES DAILY AS DIRECTED   montelukast  (SINGULAIR ) 10 MG tablet TAKE 1 TABLET(10 MG) BY MOUTH AT BEDTIME   spironolactone  (ALDACTONE ) 100 MG tablet TAKE 1 TABLET(100 MG) BY MOUTH DAILY   valsartan -hydrochlorothiazide  (DIOVAN -HCT) 320-25 MG tablet 1 tablet, Oral, Daily   Zepbound  15 mg, Subcutaneous, Weekly    Patient Active Problem List   Diagnosis Date Noted   Environmental allergies 09/20/2022   Acquired foot deformity 06/28/2016   Pes planus of both feet 06/28/2016   Bunion 06/28/2016   Ankle pain 06/28/2016   Varicose veins 06/28/2016   Sleep hypopnea 05/10/2016   OSA (obstructive sleep apnea) 05/10/2016   Nonspecific  abnormal electrocardiogram (ECG) (EKG) 02/18/2016   Witnessed apneic spells 01/05/2016   Morbid obesity (HCC) 01/05/2016   Essential hypertension 01/05/2016      Review of Systems  All other systems reviewed and are negative.     Objective:     BP 112/70   Pulse 88   Temp 98.2 F (36.8 C) (Oral)   Ht 5' 3 (1.6 m)   Wt (!) 337 lb 6.4 oz (153 kg)   SpO2 96%   BMI 59.77 kg/m    Physical Exam Vitals reviewed.  Constitutional:      Appearance: Normal appearance. She is well-groomed. She is morbidly obese.  Eyes:     Conjunctiva/sclera: Conjunctivae normal.  Neck:     Thyroid : No thyromegaly.  Cardiovascular:     Rate and Rhythm: Normal rate and regular rhythm.     Pulses: Normal pulses.     Heart sounds: S1 normal and S2 normal.  Pulmonary:     Effort: Pulmonary effort is normal.     Breath sounds: Normal breath sounds and air entry.  Abdominal:     General: Bowel sounds are normal.  Musculoskeletal:     Right lower leg: No edema.     Left lower leg: No edema.  Neurological:     Mental Status: She is alert and oriented to person, place, and time. Mental status is at baseline.     Gait: Gait is intact.  Psychiatric:  Mood and Affect: Mood and affect normal.        Speech: Speech normal.        Behavior: Behavior normal.        Judgment: Judgment normal.       The 10-year ASCVD risk score (Arnett DK, et al., 2019) is: 1.6%    Assessment & Plan:  Breast cancer screening by mammogram -     3D Screening Mammogram, Left and Right; Future  Morbid obesity (HCC) Assessment & Plan: On zepbound  15 mg weekly, she is having trouble remembering her injection, has missed a few doses. I recommended she set an alarm on her phone to remind her. Will continue the 15 mg weekly dose as she is tolerating this well. RTC in 6 months.   Orders: -     Zepbound ; Inject 15 mg into the skin once a week.  Dispense: 2 mL; Refill: 5  Cold intolerance Pt is reporting this  as a new symptoms, she reports she is a vegetarian, has not eaten meat in 6 years, will check B12, vitamin D  and iron panel.  -     Iron, TIBC and Ferritin Panel -     VITAMIN D  25 Hydroxy (Vit-D Deficiency, Fractures) -     Vitamin B12  Vegetarian diet -     Iron, TIBC and Ferritin Panel -     VITAMIN D  25 Hydroxy (Vit-D Deficiency, Fractures) -     Vitamin B12     Return in about 6 months (around 05/06/2024) for annual physical exam.    Heron CHRISTELLA Sharper, MD

## 2023-11-07 NOTE — Assessment & Plan Note (Signed)
 On zepbound 15 mg weekly, she is having trouble remembering her injection, has missed a few doses. I recommended she set an alarm on her phone to remind her. Will continue the 15 mg weekly dose as she is tolerating this well. RTC in 6 months.

## 2023-11-08 LAB — IRON,TIBC AND FERRITIN PANEL
%SAT: 32 % (ref 16–45)
Ferritin: 203 ng/mL — ABNORMAL HIGH (ref 16–154)
Iron: 93 ug/dL (ref 40–190)
TIBC: 294 ug/dL (ref 250–450)

## 2023-11-25 ENCOUNTER — Other Ambulatory Visit: Payer: Self-pay | Admitting: Family Medicine

## 2023-11-25 DIAGNOSIS — J301 Allergic rhinitis due to pollen: Secondary | ICD-10-CM

## 2024-01-02 ENCOUNTER — Telehealth: Payer: Self-pay

## 2024-01-02 ENCOUNTER — Other Ambulatory Visit (HOSPITAL_COMMUNITY): Payer: Self-pay

## 2024-01-02 NOTE — Telephone Encounter (Signed)
 Pharmacy Patient Advocate Encounter   Received notification from Fax that prior authorization for Zepbound 15MG /0.5ML pen-injectors is required/requested.   Insurance verification completed.   The patient is insured through [facility] .   Per test claim: The current 28 day co-pay is, $0.  No PA needed at this time. This test claim was processed through [facility]- copay amounts may vary at other pharmacies due to pharmacy/plan contracts, or as the patient moves through the different stages of their insurance plan.

## 2024-02-06 ENCOUNTER — Ambulatory Visit

## 2024-02-13 ENCOUNTER — Ambulatory Visit
Admission: RE | Admit: 2024-02-13 | Discharge: 2024-02-13 | Disposition: A | Discharge status: Home / Self Care | Source: Ambulatory Visit | Attending: Family Medicine | Admitting: Family Medicine

## 2024-02-13 DIAGNOSIS — Z1231 Encounter for screening mammogram for malignant neoplasm of breast: Secondary | ICD-10-CM | POA: Diagnosis not present

## 2024-02-20 ENCOUNTER — Encounter: Payer: Self-pay | Admitting: Family Medicine

## 2024-04-06 ENCOUNTER — Other Ambulatory Visit: Payer: Self-pay | Admitting: Internal Medicine

## 2024-05-07 ENCOUNTER — Encounter: Payer: Self-pay | Admitting: Family Medicine

## 2024-05-07 ENCOUNTER — Ambulatory Visit (INDEPENDENT_AMBULATORY_CARE_PROVIDER_SITE_OTHER): Payer: BC Managed Care – PPO | Admitting: Family Medicine

## 2024-05-07 VITALS — BP 122/82 | HR 70 | Temp 98.2°F | Ht 64.25 in | Wt 339.7 lb

## 2024-05-07 DIAGNOSIS — J301 Allergic rhinitis due to pollen: Secondary | ICD-10-CM | POA: Diagnosis not present

## 2024-05-07 DIAGNOSIS — Z0000 Encounter for general adult medical examination without abnormal findings: Secondary | ICD-10-CM | POA: Diagnosis not present

## 2024-05-07 DIAGNOSIS — Z13220 Encounter for screening for lipoid disorders: Secondary | ICD-10-CM | POA: Diagnosis not present

## 2024-05-07 DIAGNOSIS — Z789 Other specified health status: Secondary | ICD-10-CM

## 2024-05-07 DIAGNOSIS — G4733 Obstructive sleep apnea (adult) (pediatric): Secondary | ICD-10-CM

## 2024-05-07 LAB — LIPID PANEL
Cholesterol: 182 mg/dL (ref 0–200)
HDL: 46.5 mg/dL (ref >39.00)
LDL Cholesterol: 113 mg/dL — ABNORMAL HIGH (ref 0–99)
NonHDL: 135.89
Total CHOL/HDL Ratio: 4
Triglycerides: 114 mg/dL (ref 0.0–149.0)
VLDL: 22.8 mg/dL (ref 0.0–40.0)

## 2024-05-07 LAB — COMPREHENSIVE METABOLIC PANEL WITH GFR
ALT: 13 U/L (ref 0–35)
AST: 15 U/L (ref 0–37)
Albumin: 4 g/dL (ref 3.5–5.2)
Alkaline Phosphatase: 49 U/L (ref 39–117)
BUN: 16 mg/dL (ref 6–23)
CO2: 24 meq/L (ref 19–32)
Calcium: 9.2 mg/dL (ref 8.4–10.5)
Chloride: 105 meq/L (ref 96–112)
Creatinine, Ser: 1.01 mg/dL (ref 0.40–1.20)
GFR: 69.16 mL/min (ref >60.00)
Glucose, Bld: 89 mg/dL (ref 70–99)
Potassium: 4.3 meq/L (ref 3.5–5.1)
Sodium: 134 meq/L — ABNORMAL LOW (ref 135–145)
Total Bilirubin: 0.5 mg/dL (ref 0.2–1.2)
Total Protein: 7.7 g/dL (ref 6.0–8.3)

## 2024-05-07 MED ORDER — MONTELUKAST SODIUM 10 MG PO TABS: 10.0000 mg | ORAL_TABLET | Freq: Every day | ORAL | 5 refills | Status: DC

## 2024-05-07 MED ORDER — ZEPBOUND 2.5 MG/0.5ML ~~LOC~~ SOAJ: 2.5000 mg | SUBCUTANEOUS | 0 refills | Status: DC

## 2024-05-07 NOTE — Progress Notes (Unsigned)
 Complete physical exam  Patient: Kathy [name]   DOB: Sep 30, 1981   43 y.o. Female  MRN: [id number]  Subjective:    Chief Complaint  Patient presents with  . Annual Exam    Kathy [name] is a 43 y.o. female who presents today for a complete physical exam. She reports consuming a vegetarian diet. Eats dairy and eggs but no fish or meat. The patient does not participate in regular exercise at present. She generally feels well. She reports sleeping somewhat poorly, she had some stress recently, does have . She does not have additional problems to discuss today.    Most recent fall risk assessment:    05/29/2021    2:32 PM  Fall Risk   Falls in the past year? 0  Number falls in past yr: 0  Injury with Fall? 0  Risk for fall due to : No Fall Risks  Follow up Falls evaluation completed      Data saved with a previous flowsheet row definition     Most recent depression screenings:    05/07/2024   12:59 PM 11/07/2023    1:29 PM  PHQ 2/9 Scores  PHQ - 2 Score 0 0  PHQ- 9 Score 3     Vision:Within last year and Dental: No current dental problems and Receives regular dental care  Patient Active Problem List   Diagnosis Date Noted  . Environmental allergies 09/20/2022  . Acquired foot deformity 06/28/2016  . Pes planus of both feet 06/28/2016  . Bunion 06/28/2016  . Ankle pain 06/28/2016  . Varicose veins 06/28/2016  . Sleep hypopnea 05/10/2016  . OSA (obstructive sleep apnea) 05/10/2016  . Nonspecific abnormal electrocardiogram (ECG) (EKG) 02/18/2016  . Witnessed apneic spells 01/05/2016  . Morbid obesity (HCC) 01/05/2016  . Essential hypertension 01/05/2016      Patient Care Team: Ozell Heron HERO, MD as PCP - General (Family Medicine) Santo Stanly LABOR, MD as PCP - Cardiology (Cardiology)   Outpatient Medications Prior to Visit  Medication Sig  . Azelastine  HCl 0.15 % SOLN Place 1 spray into the nose daily.  . carvedilol  (COREG ) 25 MG tablet TAKE 1  TABLET(25 MG) BY MOUTH TWICE DAILY WITH A MEAL  . cetirizine  (ZYRTEC ) 10 MG tablet Take 1 tablet (10 mg total) by mouth daily.  . ferrous sulfate 325 (65 FE) MG tablet Take 325 mg by mouth daily with breakfast.  . fluticasone (FLONASE) 50 MCG/ACT nasal spray Place into both nostrils daily.  . hydrALAZINE  (APRESOLINE ) 50 MG tablet TAKE 1 AND 1/2 TABLETS BY MOUTH THREE TIMES DAILY AS DIRECTED  . spironolactone  (ALDACTONE ) 100 MG tablet TAKE 1 TABLET(100 MG) BY MOUTH DAILY  . valsartan -hydrochlorothiazide  (DIOVAN -HCT) 320-25 MG tablet TAKE 1 TABLET BY MOUTH DAILY  . [DISCONTINUED] montelukast  (SINGULAIR ) 10 MG tablet TAKE 1 TABLET(10 MG) BY MOUTH AT BEDTIME  . [DISCONTINUED] brompheniramine-pseudoephedrine-DM 30-2-10 MG/5ML syrup Take 5-10 mLs by mouth 4 (four) times daily as needed.  . [DISCONTINUED] tirzepatide  (ZEPBOUND ) 15 MG/0.5ML Pen Inject 15 mg into the skin once a week.   No facility-administered medications prior to visit.    Review of Systems  HENT:  Negative for hearing loss.   Eyes:  Negative for blurred vision.  Respiratory:  Negative for shortness of breath.   Cardiovascular:  Negative for chest pain.  Gastrointestinal: Negative.   Genitourinary: Negative.   Musculoskeletal:  Negative for back pain.  Neurological:  Negative for headaches.  Psychiatric/Behavioral:  Negative for depression.  Objective:     BP 122/82   Pulse 70   Temp 98.2 F (36.8 C) (Oral)   Ht 5' 4.25 (1.632 m)   Wt (!) 339 lb 11.2 oz (154.1 kg)   SpO2 98%   BMI 57.86 kg/m  {Vitals History (Optional):23777}  Physical Exam Vitals reviewed.  Constitutional:      Appearance: Normal appearance. She is well-groomed. She is morbidly obese.  HENT:     Right Ear: Tympanic membrane and ear canal normal.     Left Ear: Tympanic membrane and ear canal normal.     Mouth/Throat:     Mouth: Mucous membranes are moist.     Pharynx: No posterior oropharyngeal erythema.  Eyes:      Conjunctiva/sclera: Conjunctivae normal.  Neck:     Thyroid : No thyromegaly.  Cardiovascular:     Rate and Rhythm: Normal rate and regular rhythm.     Pulses: Normal pulses.     Heart sounds: S1 normal and S2 normal.  Pulmonary:     Effort: Pulmonary effort is normal.     Breath sounds: Normal breath sounds and air entry.  Abdominal:     General: Abdomen is flat. Bowel sounds are normal.     Palpations: Abdomen is soft.  Musculoskeletal:     Right lower leg: No edema.     Left lower leg: No edema.  Lymphadenopathy:     Cervical: No cervical adenopathy.  Neurological:     Mental Status: She is alert and oriented to person, place, and time. Mental status is at baseline.     Gait: Gait is intact.  Psychiatric:        Mood and Affect: Mood and affect normal.        Speech: Speech normal.        Behavior: Behavior normal.        Judgment: Judgment normal.      No results found for any visits on 05/07/24. {Show previous labs (optional):23779}    Assessment & Plan:    Routine Health Maintenance and Physical Exam  Immunization History  Administered Date(s) Administered  . Janssen (J&J) SARS-COV-2 Vaccination 02/07/2020  . PFIZER(Purple Top)SARS-COV-2 Vaccination 10/29/2020  . Td 06/10/2015    Health Maintenance  Topic Date Due  . Hepatitis B Vaccines (1 of 3 - 19+ 3-dose series) Never done  . HPV VACCINES (1 - 3-dose SCDM series) Never done  . COVID-19 Vaccine (3 - 2024-25 season) 07/03/2023  . Hepatitis C Screening  11/06/2024 (Originally 11/14/2000)  . INFLUENZA VACCINE  06/01/2024  . DTaP/Tdap/Td (2 - Tdap) 06/09/2025  . Cervical Cancer Screening (HPV/Pap Cotest)  09/21/2027  . HIV Screening  Completed  . Meningococcal B Vaccine  Aged Out    Discussed health benefits of physical activity, and encouraged her to engage in regular exercise appropriate for her age and condition.  Lipid screening -     Lipid panel; Future  Routine general medical examination at a  health care facility -     Comprehensive metabolic panel with GFR; Future  Non-seasonal allergic rhinitis due to pollen -     Montelukast  Sodium; Take 1 tablet (10 mg total) by mouth at bedtime.  Dispense: 30 tablet; Refill: 5  Morbid obesity (HCC) -     Zepbound ; Inject 2.5 mg into the skin once a week.  Dispense: 2 mL; Refill: 0  OSA (obstructive sleep apnea) -     Zepbound ; Inject 2.5 mg into the skin once a week.  Dispense:  2 mL; Refill: 0  History of measles, mumps, rubella (MMR) vaccination unknown -     Measles/Mumps/Rubella Immunity; Future    Return in 1 year (on 05/07/2025).     Heron CHRISTELLA Sharper, MD

## 2024-05-07 NOTE — Patient Instructions (Signed)

## 2024-05-08 LAB — MEASLES/MUMPS/RUBELLA IMMUNITY
Mumps IgG: 63.3 [AU]/ml
Rubella: 8.64 {index}
Rubeola IgG: 17.7 [AU]/ml

## 2024-05-10 ENCOUNTER — Other Ambulatory Visit (HOSPITAL_COMMUNITY): Payer: Self-pay

## 2024-05-14 ENCOUNTER — Ambulatory Visit: Payer: Self-pay | Admitting: Family Medicine

## 2024-05-18 ENCOUNTER — Other Ambulatory Visit (HOSPITAL_COMMUNITY): Payer: Self-pay

## 2024-05-18 ENCOUNTER — Telehealth: Payer: Self-pay

## 2024-05-18 NOTE — Telephone Encounter (Signed)
 Pharmacy Patient Advocate Encounter   Received notification from CoverMyMeds that prior authorization for Zepbound  2.5MG /0.5ML pen-injectors  is required/requested.   Insurance verification completed.   The patient is insured through [facility] .   Per test claim: PA required; PA started via CoverMyMeds. KEY [id number] . Waiting for clinical questions to populate.

## 2024-05-21 ENCOUNTER — Other Ambulatory Visit (HOSPITAL_COMMUNITY): Payer: Self-pay

## 2024-05-21 NOTE — Telephone Encounter (Signed)
 PLEASE BE ADVISED Clinical questions have been answered and PA submitted.TO PLAN. PA currently Pending.

## 2024-05-22 ENCOUNTER — Other Ambulatory Visit (HOSPITAL_BASED_OUTPATIENT_CLINIC_OR_DEPARTMENT_OTHER): Payer: Self-pay

## 2024-05-23 ENCOUNTER — Other Ambulatory Visit (HOSPITAL_COMMUNITY): Payer: Self-pay

## 2024-05-23 NOTE — Telephone Encounter (Signed)
 Pharmacy Patient Advocate Encounter  Received notification from [facility] that Prior Authorization for Zepbound  2.5MG /0.5ML pen-injectors  has been DENIED.  See denial reason below. No denial letter attached in CMM. Will attach denial letter to Media tab once received.   YOUR PLAN DOES NOT COVER THIS DRUG. For your plan, you may need to try 1 other covered drug. If you have tried 1 other covered drug, another option for you is a tirzepatide  product that has the same active ingredient at the same strength and dosage as the requested drug. We have denied your request. You did not meet one of these options: A) You have tried the primary covered drug and it did not work well for you, or B) Your doctor gives us  a medical reason you cannot take the primary covered drug. We reviewed the information we had. Your request has been denied. Your doctor can send us  any new or missing information for us  to review. The primary covered drug for your plan is [facility]. The secondary covered drug for your plan is a tirzepatide  product that has the same active ingredient at the same strength and dosage as the requested drug. Your doctor may need to get approval from your plan for covered drugs. For this drug, you may have to meet other criteria. You can request the drug policy for more details. You can also request other plan documents for your review Pharmacy Patient Advocate Encounter    PLEASE BE ADVISED   Per test claim:  WEGOVY  is preferred by the insurance.  If suggested medication is appropriate, Please send in a new RX and discontinue this one. If not, please advise as to why it's not appropriate so that we may request a Prior Authorization. Please note, some preferred medications may still require a PA.  If the suggested medications have not been trialed and there are no contraindications to their use, the PA will not be submitted, as it will not be approved.

## 2024-05-23 NOTE — Telephone Encounter (Signed)
 Can you send a message to the patient asking if she would be willing to switch to the wegovy? It will be covered if she would be intereested in trying it

## 2024-06-11 ENCOUNTER — Other Ambulatory Visit: Payer: Self-pay | Admitting: Internal Medicine

## 2024-09-11 ENCOUNTER — Other Ambulatory Visit: Payer: Self-pay | Admitting: Internal Medicine

## 2024-09-28 ENCOUNTER — Other Ambulatory Visit: Payer: Self-pay | Admitting: Internal Medicine

## 2024-10-01 ENCOUNTER — Other Ambulatory Visit: Payer: Self-pay | Admitting: Internal Medicine

## 2024-10-10 ENCOUNTER — Other Ambulatory Visit: Payer: Self-pay | Admitting: Internal Medicine

## 2024-10-11 ENCOUNTER — Other Ambulatory Visit: Payer: Self-pay | Admitting: Internal Medicine

## 2024-11-03 ENCOUNTER — Other Ambulatory Visit: Payer: Self-pay | Admitting: Internal Medicine

## 2024-11-05 ENCOUNTER — Encounter: Payer: Self-pay | Admitting: Family Medicine

## 2024-11-05 ENCOUNTER — Ambulatory Visit (INDEPENDENT_AMBULATORY_CARE_PROVIDER_SITE_OTHER): Admitting: Family Medicine

## 2024-11-05 VITALS — BP 118/76 | HR 82 | Temp 98.2°F | Ht 64.25 in | Wt 340.7 lb

## 2024-11-05 DIAGNOSIS — Z1231 Encounter for screening mammogram for malignant neoplasm of breast: Secondary | ICD-10-CM

## 2024-11-05 DIAGNOSIS — I10 Essential (primary) hypertension: Secondary | ICD-10-CM

## 2024-11-05 MED ORDER — WEGOVY 0.25 MG/0.5ML ~~LOC~~ SOAJ: 0.2500 mg | SUBCUTANEOUS | 0 refills | Status: DC

## 2024-11-05 MED ORDER — HYDRALAZINE HCL 100 MG PO TABS: 100.0000 mg | ORAL_TABLET | Freq: Two times a day (BID) | ORAL | 1 refills | Status: DC

## 2024-11-05 MED ORDER — VALSARTAN-HYDROCHLOROTHIAZIDE 320-25 MG PO TABS: 1.0000 | ORAL_TABLET | Freq: Every day | ORAL | 1 refills | Status: DC

## 2024-11-05 MED ORDER — SPIRONOLACTONE 100 MG PO TABS: 100.0000 mg | ORAL_TABLET | Freq: Every day | ORAL | 1 refills | Status: DC

## 2024-11-05 MED ORDER — CARVEDILOL 25 MG PO TABS: ORAL_TABLET | ORAL | 1 refills | Status: DC

## 2024-11-05 NOTE — Progress Notes (Signed)
 "  Established Patient Office Visit  Subjective   Patient ID: Kathy [name], female    DOB: Mar 23, 1981  Age: 44 y.o. MRN: [id number]  Chief Complaint  Patient presents with   Medical Management of Chronic Issues    HPI  Discussed the use of AI scribe software for clinical note transcription with the patient, who gave verbal consent to proceed.  History of Present Illness   Kathy [name] is a 44 year old female with hypertension who presents for a follow-up visit.  She has had severe hypertension since age 71 with prior readings in the 200s. She currently takes hydralazine  75 mg twice daily, carvedilol , spironolactone  100 mg daily, and Diovan  HCT at night. She has trouble remembering a midday hydralazine  dose and instead takes two tablets in the morning and two at night.  She previously had normal kidney function in July but is unsure if she ever had a kidney ultrasound.  She struggles with weight management. Zepbound  for weight loss was denied by insurance. She has not tried Wegovy  and is worried about nausea and constipation with weight loss medications.  She tends to skip meals and usually eats only two meals per day, with occasional overeating.  She has no chest pain or shortness of breath.      Current Outpatient Medications  Medication Instructions   Azelastine  HCl 0.15 % SOLN 1 spray, Nasal, Daily   BIOTIN PO Daily   carvedilol  (COREG ) 25 MG tablet TAKE 1 TABLET(25 MG) BY MOUTH TWICE DAILY WITH A MEAL Pt must schedule an overdue followup appt with Cardiology for any more refills. [phone] 1st attempt Thank You   cetirizine  (ZYRTEC ) 10 mg, Oral, Daily   Ferrous Sulfate (IRON PO) 10 mg, Daily   ferrous sulfate 325 mg, Daily with breakfast   fluticasone (FLONASE) 50 MCG/ACT nasal spray Daily   hydrALAZINE  (APRESOLINE ) 100 mg, Oral, 2 times daily   montelukast  (SINGULAIR ) 10 mg, Oral, Daily at bedtime   OVER THE COUNTER MEDICATION Daily   spironolactone   (ALDACTONE ) 100 mg, Oral, Daily   valsartan -hydrochlorothiazide  (DIOVAN -HCT) 320-25 MG tablet 1 tablet, Oral, Daily   Wegovy  0.25 mg, Subcutaneous, Weekly    Patient Active Problem List   Diagnosis Date Noted   Environmental allergies 09/20/2022   Acquired foot deformity 06/28/2016   Pes planus of both feet 06/28/2016   Bunion 06/28/2016   Ankle pain 06/28/2016   Varicose veins 06/28/2016   Sleep hypopnea 05/10/2016   OSA (obstructive sleep apnea) 05/10/2016   Nonspecific abnormal electrocardiogram (ECG) (EKG) 02/18/2016   Witnessed apneic spells 01/05/2016   Morbid obesity (HCC) 01/05/2016   Essential hypertension 01/05/2016     Review of Systems  All other systems reviewed and are negative.     Objective:     BP 118/76   Pulse 82   Temp 98.2 F (36.8 C) (Oral)   Ht 5' 4.25 (1.632 m)   Wt (!) 340 lb 11.2 oz (154.5 kg)   SpO2 98%   BMI 58.03 kg/m    Physical Exam Vitals reviewed.  Constitutional:      Appearance: Normal appearance. She is morbidly obese.  Eyes:     Conjunctiva/sclera: Conjunctivae normal.  Cardiovascular:     Rate and Rhythm: Normal rate and regular rhythm.     Heart sounds: Normal heart sounds. No murmur heard. Pulmonary:     Effort: Pulmonary effort is normal.     Breath sounds: Normal breath sounds. No wheezing.  Neurological:  Mental Status: She is alert and oriented to person, place, and time. Mental status is at baseline.  Psychiatric:        Mood and Affect: Mood normal.        Behavior: Behavior normal.      No results found for any visits on 11/05/24.    The 10-year ASCVD risk score (Arnett DK, et al., 2019) is: 1.4%    Assessment & Plan:  Essential hypertension -     Spironolactone ; Take 1 tablet (100 mg total) by mouth daily.  Dispense: 90 tablet; Refill: 1 -     hydrALAZINE  HCl; Take 1 tablet (100 mg total) by mouth in the morning and at bedtime.  Dispense: 180 tablet; Refill: 1 -     Carvedilol ; TAKE 1 TABLET(25  MG) BY MOUTH TWICE DAILY WITH A MEAL Pt must schedule an overdue followup appt with Cardiology for any more refills. [phone] 1st attempt Thank You  Dispense: 180 tablet; Refill: 1 -     Valsartan -hydroCHLOROthiazide ; Take 1 tablet by mouth daily.  Dispense: 90 tablet; Refill: 1 -     VAS US  RENAL ARTERY DUPLEX; Future -     Salivary Cortisol; Future -     Salivary Cortisol; Future  Breast cancer screening by mammogram -     3D Screening Mammogram, Left and Right; Future  Morbid obesity (HCC) -     Wegovy ; Inject 0.25 mg into the skin once a week.  Dispense: 2 mL; Refill: 0   Assessment and Plan    Essential hypertension Hypertension is well-controlled with current medications. Blood pressure is 118/76 mmHg. She is on multiple antihypertensive medications, including hydralazine , carvedilol , spironolactone , and Diovan  HCT. Hydralazine  is taken as 100 mg twice daily instead of 75 mg three times daily. Discussed potential underlying causes of hypertension, including renal artery stenosis and Cushing's syndrome. She is open to further evaluation for secondary causes. - Changed hydralazine  prescription to 100 mg twice daily. - Ordered renal artery ultrasound to assess blood flow to kidneys. - Ordered salivary cortisol test to evaluate for Cushing's syndrome. - Continue current antihypertensive regimen.  Morbid obesity Previous attempts at weight loss medication with Zepbound  were denied by insurance. Discussed potential use of Wegovy , which is covered by insurance. Discussed benefits of weight loss, including potential reduction in antihypertensive medications. Discussed potential side effects of Wegovy , including nausea and constipation, and strategies to manage these. Discussed alternative of gastric sleeve surgery for weight loss. She is open to trying Wegovy . - Submitted prescription for Wegovy  to pharmacy. - Scheduled follow-up in three months to assess response to Wegovy . - Discussed  potential side effects and management strategies for Wegovy .   General health maintenance She is due for a mammogram in April. - Ordered mammogram for April.        Return in about 3 months (around 02/03/2025) for HTN.    Heron CHRISTELLA Sharper, MD "

## 2024-11-07 ENCOUNTER — Other Ambulatory Visit: Payer: Self-pay | Admitting: Family Medicine

## 2024-11-08 ENCOUNTER — Encounter (HOSPITAL_BASED_OUTPATIENT_CLINIC_OR_DEPARTMENT_OTHER): Payer: Self-pay

## 2024-11-09 ENCOUNTER — Ambulatory Visit (INDEPENDENT_AMBULATORY_CARE_PROVIDER_SITE_OTHER)

## 2024-11-09 ENCOUNTER — Other Ambulatory Visit (HOSPITAL_COMMUNITY): Payer: Self-pay

## 2024-11-09 ENCOUNTER — Telehealth: Payer: Self-pay

## 2024-11-09 ENCOUNTER — Ambulatory Visit: Payer: Self-pay | Admitting: Family Medicine

## 2024-11-09 DIAGNOSIS — I10 Essential (primary) hypertension: Secondary | ICD-10-CM

## 2024-11-09 NOTE — Telephone Encounter (Signed)
 Pharmacy Patient Advocate Encounter   Received notification from [facility] that prior authorization for Wegovy  0.25 is required/requested.   Insurance verification completed.   The patient is insured through [facility].   Per test claim: Per test claim, medication is not covered due to plan/benefit exclusion, PA not submitted at this time

## 2024-11-26 ENCOUNTER — Ambulatory Visit: Payer: Self-pay | Admitting: Family Medicine

## 2024-11-26 LAB — CORTISOL, SALIVARY
Salivary Cortisol, MS: 0.057 ug/dL
Salivary Cortisol, MS: 0.318 ug/dL

## 2024-11-27 ENCOUNTER — Ambulatory Visit: Payer: Self-pay | Admitting: Family Medicine

## 2024-11-27 DIAGNOSIS — J301 Allergic rhinitis due to pollen: Secondary | ICD-10-CM

## 2024-11-27 DIAGNOSIS — I10 Essential (primary) hypertension: Secondary | ICD-10-CM

## 2024-11-27 DIAGNOSIS — E249 Cushing's syndrome, unspecified: Secondary | ICD-10-CM

## 2024-12-05 MED ORDER — WEGOVY 0.25 MG/0.5ML ~~LOC~~ SOAJ: 0.2500 mg | SUBCUTANEOUS | 0 refills | Status: AC

## 2024-12-05 MED ORDER — VALSARTAN-HYDROCHLOROTHIAZIDE 320-25 MG PO TABS: 1.0000 | ORAL_TABLET | Freq: Every day | ORAL | 1 refills | Status: AC

## 2024-12-05 MED ORDER — SPIRONOLACTONE 100 MG PO TABS: 100.0000 mg | ORAL_TABLET | Freq: Every day | ORAL | 1 refills | Status: AC

## 2024-12-05 MED ORDER — CARVEDILOL 25 MG PO TABS: ORAL_TABLET | ORAL | 1 refills | Status: AC

## 2024-12-05 MED ORDER — HYDRALAZINE HCL 100 MG PO TABS: 100.0000 mg | ORAL_TABLET | Freq: Two times a day (BID) | ORAL | 1 refills | Status: AC

## 2024-12-05 MED ORDER — MONTELUKAST SODIUM 10 MG PO TABS: 10.0000 mg | ORAL_TABLET | Freq: Every day | ORAL | 1 refills | Status: AC

## 2025-02-07 ENCOUNTER — Ambulatory Visit: Admitting: Family Medicine

## 2025-02-14 ENCOUNTER — Ambulatory Visit
